# Patient Record
Sex: Male | Born: 1990 | Race: White | Hispanic: No | Marital: Married | Smoking: Never smoker
Health system: Southern US, Community
[De-identification: ages and names within clinical notes are randomized; demographics above are authoritative.]

## PROBLEM LIST (undated history)

## (undated) DIAGNOSIS — I10 Essential (primary) hypertension: Secondary | ICD-10-CM

---

## 2016-03-30 ENCOUNTER — Ambulatory Visit (INDEPENDENT_AMBULATORY_CARE_PROVIDER_SITE_OTHER): Payer: PRIVATE HEALTH INSURANCE | Admitting: Family Medicine

## 2016-03-30 ENCOUNTER — Encounter: Payer: Self-pay | Admitting: Family Medicine

## 2016-03-30 VITALS — BP 136/82 | Ht 72.0 in | Wt 289.0 lb

## 2016-03-30 DIAGNOSIS — Z13220 Encounter for screening for lipoid disorders: Secondary | ICD-10-CM | POA: Diagnosis not present

## 2016-03-30 DIAGNOSIS — R946 Abnormal results of thyroid function studies: Secondary | ICD-10-CM | POA: Diagnosis not present

## 2016-03-30 DIAGNOSIS — R7989 Other specified abnormal findings of blood chemistry: Secondary | ICD-10-CM

## 2016-03-30 DIAGNOSIS — Z0000 Encounter for general adult medical examination without abnormal findings: Secondary | ICD-10-CM | POA: Diagnosis not present

## 2016-03-30 NOTE — Progress Notes (Signed)
   Subjective:    Patient ID: Frank Ruiz, male    DOB: 02/14/1991, 25 y.o.   MRN: [id number]  HPI The patient comes in today for a wellness visit.  Gets reg exervcise  Not the best  Not a flu shot person   Health overall good, played football and wrestling in school, btroke thumb       A review of their health history was completed.  A review of medications was also completed.  Any needed refills; suppose to be on thyroid med but hasn't seen a doctor for refills  Eating habits: ok  Falls/  MVA accidents in past few months: none  Regular exercise: none  Specialist pt sees on regular basis: none  Preventative health issues were discussed.   Additional concerns: need rx for thyroid medicine    Review of Systems  Constitutional: Negative for activity change, appetite change and fever.  HENT: Negative for congestion and rhinorrhea.   Eyes: Negative for discharge.  Respiratory: Negative for cough and wheezing.   Cardiovascular: Negative for chest pain.  Gastrointestinal: Negative for abdominal pain, blood in stool and vomiting.  Genitourinary: Negative for difficulty urinating and frequency.  Musculoskeletal: Negative for neck pain.  Skin: Negative for rash.  Allergic/Immunologic: Negative for environmental allergies and food allergies.  Neurological: Negative for weakness and headaches.  Psychiatric/Behavioral: Negative for agitation.  All other systems reviewed and are negative.      Objective:   Physical Exam  Constitutional: He appears well-developed and well-nourished.  HENT:  Head: Normocephalic and atraumatic.  Right Ear: External ear normal.  Left Ear: External ear normal.  Nose: Nose normal.  Mouth/Throat: Oropharynx is clear and moist.  Eyes: EOM are normal. Pupils are equal, round, and reactive to light.  Neck: Normal range of motion. Neck supple. No thyromegaly present.  Cardiovascular: Normal rate, regular rhythm and normal heart sounds.   No  murmur heard. Pulmonary/Chest: Effort normal and breath sounds normal. No respiratory distress. He has no wheezes.  Abdominal: Soft. Bowel sounds are normal. He exhibits no distension and no mass. There is no tenderness.  Genitourinary: Penis normal.  Musculoskeletal: Normal range of motion. He exhibits no edema.  Lymphadenopathy:    He has no cervical adenopathy.  Neurological: He is alert. He exhibits normal muscle tone.  Skin: Skin is warm and dry. No erythema.  Psychiatric: He has a normal mood and affect. His behavior is normal. Judgment normal.  Vitals reviewed.         Assessment & Plan:  Impression wellness exam #2 hypothyroidism per history. No symptoms consistent. Positive family history of hypothyroidism. Plan appropriate blood work. Await TSH results before reinitiating thyroid medicine rationale discussed with patient. Diet exercise discussed

## 2016-06-12 ENCOUNTER — Ambulatory Visit (INDEPENDENT_AMBULATORY_CARE_PROVIDER_SITE_OTHER): Payer: PRIVATE HEALTH INSURANCE | Admitting: Family Medicine

## 2016-06-12 ENCOUNTER — Encounter: Payer: Self-pay | Admitting: Family Medicine

## 2016-06-12 VITALS — BP 130/82 | Temp 98.3°F | Ht 72.0 in | Wt 297.6 lb

## 2016-06-12 DIAGNOSIS — H6502 Acute serous otitis media, left ear: Secondary | ICD-10-CM

## 2016-06-12 MED ORDER — AMOXICILLIN-POT CLAVULANATE 875-125 MG PO TABS: 1.0000 | ORAL_TABLET | Freq: Two times a day (BID) | ORAL | 0 refills | Status: AC

## 2016-06-12 NOTE — Progress Notes (Signed)
   Subjective:    Patient ID: Frank Ruiz, male    DOB: 11/21/1990, 26 y.o.   MRN: [id number]  Otalgia   There is pain in the left ear. The current episode started in the past 7 days. Associated symptoms include a sore throat.   Started sun morn  Started hurting some   Ear apin the worse, now throat and ear  Last week some runny nose  Throat more sore furing all day and night , left side  No fever, used topical wax agent      Review of Systems  HENT: Positive for ear pain and sore throat.    No oiting or diarrhea     Objective:   Physical Exam  aert vitas Alert vital stable no acute distress HEENT moderate nasal congestion left otitis media. Old scarring. Mild preauricular lymph node somewhat tender lungs clear. Heart regular in rhythm      Assessment & Plan:  Impression left otitis media plan antibiotics prescribed symptom care discussed warning signs discussed

## 2016-12-11 ENCOUNTER — Ambulatory Visit (INDEPENDENT_AMBULATORY_CARE_PROVIDER_SITE_OTHER): Payer: PRIVATE HEALTH INSURANCE | Admitting: Family Medicine

## 2016-12-11 ENCOUNTER — Encounter: Payer: Self-pay | Admitting: Family Medicine

## 2016-12-11 VITALS — BP 138/80 | Ht 72.0 in | Wt 297.0 lb

## 2016-12-11 DIAGNOSIS — Z13220 Encounter for screening for lipoid disorders: Secondary | ICD-10-CM

## 2016-12-11 DIAGNOSIS — Z79899 Other long term (current) drug therapy: Secondary | ICD-10-CM | POA: Diagnosis not present

## 2016-12-11 DIAGNOSIS — Z0000 Encounter for general adult medical examination without abnormal findings: Secondary | ICD-10-CM | POA: Diagnosis not present

## 2016-12-11 NOTE — Progress Notes (Signed)
   Subjective:    Patient ID: Frank Ruiz, male    DOB: 01/25/1991, 26 y.o.   MRN: [id number]  HPI The patient comes in today for a wellness visit.    A review of their health history was completed.  A review of medications was also completed.  Any needed refills; None  Eating habits: Good  Falls/  MVA accidents in past few months: None  Regular exercise: No  Specialist pt sees on regular basis: None  Preventative health issues were discussed.   Additional concerns: None  Exercise not too much  Played football and def end    Hx of thub fx   Diet not the best    Review of Systems  Constitutional: Negative for activity change, appetite change and fever.  HENT: Negative for congestion and rhinorrhea.   Eyes: Negative for discharge.  Respiratory: Negative for cough and wheezing.   Cardiovascular: Negative for chest pain.  Gastrointestinal: Negative for abdominal pain, blood in stool and vomiting.  Genitourinary: Negative for difficulty urinating and frequency.  Musculoskeletal: Negative for neck pain.  Skin: Negative for rash.  Allergic/Immunologic: Negative for environmental allergies and food allergies.  Neurological: Negative for weakness and headaches.  Psychiatric/Behavioral: Negative for agitation.  All other systems reviewed and are negative.      Objective:   Physical Exam  Constitutional: He appears well-developed and well-nourished.  HENT:  Head: Normocephalic and atraumatic.  Right Ear: External ear normal.  Left Ear: External ear normal.  Nose: Nose normal.  Mouth/Throat: Oropharynx is clear and moist.  Eyes: Pupils are equal, round, and reactive to light. EOM are normal.  Neck: Normal range of motion. Neck supple. No thyromegaly present.  Cardiovascular: Normal rate, regular rhythm and normal heart sounds.   No murmur heard. Pulmonary/Chest: Effort normal and breath sounds normal. No respiratory distress. He has no wheezes.  Abdominal: Soft.  Bowel sounds are normal. He exhibits no distension and no mass. There is no tenderness.  Genitourinary: Penis normal.  Musculoskeletal: Normal range of motion. He exhibits no edema.  Lymphadenopathy:    He has no cervical adenopathy.  Neurological: He is alert. He exhibits normal muscle tone.  Skin: Skin is warm and dry. No erythema.  Psychiatric: He has a normal mood and affect. His behavior is normal. Judgment normal.  Vitals reviewed.         Assessment & Plan:  Impression 1 well adult exam #2 borderline blood pressure discussed #3 obesity discussed plan diet exercise discussed. Appropriate blood work. Further recommendations based results.

## 2017-04-11 ENCOUNTER — Encounter: Payer: Self-pay | Admitting: Family Medicine

## 2017-04-11 ENCOUNTER — Ambulatory Visit: Payer: PRIVATE HEALTH INSURANCE | Admitting: Family Medicine

## 2017-04-11 VITALS — BP 138/98 | Temp 98.7°F | Ht 73.0 in | Wt <= 1120 oz

## 2017-04-11 DIAGNOSIS — J329 Chronic sinusitis, unspecified: Secondary | ICD-10-CM

## 2017-04-11 MED ORDER — AMOXICILLIN-POT CLAVULANATE 875-125 MG PO TABS: ORAL_TABLET | ORAL | 0 refills | Status: DC

## 2017-04-11 NOTE — Progress Notes (Signed)
   Subjective:    Patient ID: Frank Ruiz, male    DOB: 05/21/1990, 27 y.o.   MRN: [id number]  Sinusitis  This is a new problem. Episode onset: over one week  Associated symptoms include congestion, coughing, ear pain and a sore throat. Treatments tried: otc cold meds. The treatment provided no relief.   Started after christmas  Started over a week ago  Had cong and dranage  The started coughing  Throat then got sore  Some prod cough  Right ear  Pain at times, hx of o m in this ear   Some cough  No energy   pts  inlaw got sick similar illness     No fever    Review of Systems  HENT: Positive for congestion, ear pain and sore throat.   Respiratory: Positive for cough.        Objective:   Physical Exam  Alert, mild malaise. Hydration good Vitals stable. frontal/ maxillary tenderness evident positive nasal congestion. pharynx normal neck supple  lungs clear/no crackles or wheezes. heart regular in rhythm       Assessment & Plan:  Alert, mild malaise. Hydration good Vitals stable. frontal/ maxillary tenderness evident positive nasal congestion. pharynx normal neck supple  lungs clear/no crackles or wheezes. heart regular in rhythm Impression rhinosinusitis likely post viral, discussed with patient. plan antibiotics prescribed. Questions answered. Symptomatic care discussed. warning signs discussed. WSL

## 2017-05-01 ENCOUNTER — Encounter: Payer: Self-pay | Admitting: Family Medicine

## 2017-05-01 ENCOUNTER — Ambulatory Visit: Payer: PRIVATE HEALTH INSURANCE | Admitting: Family Medicine

## 2017-05-01 VITALS — BP 132/90 | Temp 98.5°F | Ht 73.0 in | Wt 305.0 lb

## 2017-05-01 DIAGNOSIS — H6501 Acute serous otitis media, right ear: Secondary | ICD-10-CM

## 2017-05-01 MED ORDER — CEFDINIR 300 MG PO CAPS: 300.0000 mg | ORAL_CAPSULE | Freq: Two times a day (BID) | ORAL | 0 refills | Status: DC

## 2017-05-01 NOTE — Progress Notes (Signed)
   Subjective:    Patient ID: Frank Ruiz, male    DOB: 06/13/1990, 27 y.o.   MRN: [id number]  Sinusitis  This is a new problem. Episode onset: 3 days. Associated symptoms include ear pain and a sore throat. Past treatments include nothing.   May have had some cong  Last wk   Now right ear hurtingAt times pain sharp at times aching worse when coughing.  And throat positive discomfort diminished energy alert active good hydration  No fever       Review of Systems  HENT: Positive for ear pain and sore throat.        Objective:   Physical Exam  Distinct right otitis media.  Positive nasal congestion left TM fluid pharynx normal neck supple lungs clear heart regular rhythm alert no acute distress      Assessment & Plan:  Impression post viral right otitis media with left TM effusion discuss antibiotics prescribed symptom care discussed

## 2017-05-02 ENCOUNTER — Telehealth: Payer: Self-pay | Admitting: Family Medicine

## 2017-05-02 NOTE — Telephone Encounter (Signed)
Patient advised per Dr Lorin PicketScott- They dont do those anymore, Dr Lorin PicketScott rec ibuprofen 200mg , take 3 pills every 6 hours as needed-if causes GI upset use Tylenol.  Patient verbalized understanding.

## 2017-05-02 NOTE — Telephone Encounter (Signed)
Pt called stating that he is in a lot of pain with his ears and is wanting to know if ear drops can be called in.    [facility]

## 2017-05-02 NOTE — Telephone Encounter (Signed)
Seen yesterday for right otitis media. Prescribed omnicef

## 2017-05-02 NOTE — Telephone Encounter (Signed)
They dont do those anymore, I rec ibuprofen 200mg , take 3 pills every 6 hours as needed-if causes GI upset use Tylenol

## 2017-12-16 ENCOUNTER — Encounter: Payer: PRIVATE HEALTH INSURANCE | Admitting: Family Medicine

## 2018-01-08 ENCOUNTER — Ambulatory Visit (INDEPENDENT_AMBULATORY_CARE_PROVIDER_SITE_OTHER): Payer: PRIVATE HEALTH INSURANCE | Admitting: Family Medicine

## 2018-01-08 ENCOUNTER — Encounter: Payer: Self-pay | Admitting: Family Medicine

## 2018-01-08 VITALS — BP 150/108 | Ht 73.0 in | Wt 313.1 lb

## 2018-01-08 DIAGNOSIS — Z0000 Encounter for general adult medical examination without abnormal findings: Secondary | ICD-10-CM

## 2018-01-08 DIAGNOSIS — Z13220 Encounter for screening for lipoid disorders: Secondary | ICD-10-CM | POA: Diagnosis not present

## 2018-01-08 DIAGNOSIS — Z79899 Other long term (current) drug therapy: Secondary | ICD-10-CM

## 2018-01-08 DIAGNOSIS — I10 Essential (primary) hypertension: Secondary | ICD-10-CM

## 2018-01-08 MED ORDER — AMLODIPINE BESYLATE 5 MG PO TABS: ORAL_TABLET | ORAL | 5 refills | Status: DC

## 2018-01-08 NOTE — Patient Instructions (Signed)

## 2018-01-08 NOTE — Progress Notes (Signed)
   Subjective:    Patient ID: Frank Ruiz, male    DOB: 1990-09-26, 27 y.o.   MRN: [id number]  HPI The patient comes in today for a wellness visit.    A review of their health history was completed.  A review of medications was also completed.  Any needed refills; No  Eating habits: Good  Falls/  MVA accidents in past few months: Yes, but did not get hurt.  Regular exercise: Yes  Specialist pt sees on regular basis: No  Preventative health issues were discussed.   Additional concerns: None  Playing fot ball     Review of Systems  Constitutional: Negative for activity change, appetite change and fever.  HENT: Negative for congestion and rhinorrhea.   Eyes: Negative for discharge.  Respiratory: Negative for cough and wheezing.   Cardiovascular: Negative for chest pain.  Gastrointestinal: Negative for abdominal pain, blood in stool and vomiting.  Genitourinary: Negative for difficulty urinating and frequency.  Musculoskeletal: Negative for neck pain.  Skin: Negative for rash.  Allergic/Immunologic: Negative for environmental allergies and food allergies.  Neurological: Negative for weakness and headaches.  Psychiatric/Behavioral: Negative for agitation.  All other systems reviewed and are negative.      Objective:   Physical Exam  Constitutional: He appears well-developed and well-nourished.  HENT:  Head: Normocephalic and atraumatic.  Right Ear: External ear normal.  Left Ear: External ear normal.  Nose: Nose normal.  Mouth/Throat: Oropharynx is clear and moist.  Eyes: Pupils are equal, round, and reactive to light. EOM are normal.  Neck: Normal range of motion. Neck supple. No thyromegaly present.  Cardiovascular: Normal rate, regular rhythm and normal heart sounds.  No murmur heard. Pulmonary/Chest: Effort normal and breath sounds normal. No respiratory distress. He has no wheezes.  Abdominal: Soft. Bowel sounds are normal. He exhibits no distension and no  mass. There is no tenderness.  Genitourinary: Penis normal.  Musculoskeletal: Normal range of motion. He exhibits no edema.  Lymphadenopathy:    He has no cervical adenopathy.  Neurological: He is alert. He exhibits normal muscle tone.  Skin: Skin is warm and dry. No erythema.  Psychiatric: He has a normal mood and affect. His behavior is normal. Judgment normal.  Vitals reviewed.         Assessment & Plan:  Impression wellness exam.  Diet discussed.  Exercise discussed.  Patient now participating in a semi-pro football.  Did not get blood work as requested.  More ordered at this time  2.  Essential hypertension.  Blood pressure is substantially elevated today.  Strong family history.  Patient has not had this for over one year.  Continues to rise.  Pros and cons and potential side effects of medicines discussed.  Patient agrees to initiate Norvasc 5 mg daily.  Follow-up in 3 months.

## 2018-01-09 LAB — HEPATIC FUNCTION PANEL
ALK PHOS: 80 IU/L (ref 39–117)
ALT: 47 IU/L — AB (ref 0–44)
AST: 27 IU/L (ref 0–40)
Albumin: 4.5 g/dL (ref 3.5–5.5)
BILIRUBIN TOTAL: 0.4 mg/dL (ref 0.0–1.2)
BILIRUBIN, DIRECT: 0.13 mg/dL (ref 0.00–0.40)
Total Protein: 7.3 g/dL (ref 6.0–8.5)

## 2018-01-09 LAB — LIPID PANEL
CHOL/HDL RATIO: 4.5 ratio (ref 0.0–5.0)
CHOLESTEROL TOTAL: 171 mg/dL (ref 100–199)
HDL: 38 mg/dL — ABNORMAL LOW (ref >39)
LDL Calculated: 113 mg/dL — ABNORMAL HIGH (ref 0–99)
TRIGLYCERIDES: 101 mg/dL (ref 0–149)
VLDL Cholesterol Cal: 20 mg/dL (ref 5–40)

## 2018-01-09 LAB — BASIC METABOLIC PANEL
BUN / CREAT RATIO: 11 (ref 9–20)
BUN: 13 mg/dL (ref 6–20)
CHLORIDE: 102 mmol/L (ref 96–106)
CO2: 25 mmol/L (ref 20–29)
Calcium: 9.2 mg/dL (ref 8.7–10.2)
Creatinine, Ser: 1.16 mg/dL (ref 0.76–1.27)
GFR calc non Af Amer: 87 mL/min/{1.73_m2} (ref >59)
GFR, EST AFRICAN AMERICAN: 101 mL/min/{1.73_m2} (ref >59)
Glucose: 88 mg/dL (ref 65–99)
POTASSIUM: 4.6 mmol/L (ref 3.5–5.2)
SODIUM: 140 mmol/L (ref 134–144)

## 2018-01-12 ENCOUNTER — Encounter: Payer: Self-pay | Admitting: Family Medicine

## 2018-04-11 ENCOUNTER — Encounter: Payer: Self-pay | Admitting: Family Medicine

## 2018-04-11 ENCOUNTER — Ambulatory Visit: Payer: PRIVATE HEALTH INSURANCE | Admitting: Family Medicine

## 2018-04-11 VITALS — BP 132/84 | Ht 73.0 in | Wt 304.8 lb

## 2018-04-11 DIAGNOSIS — H6502 Acute serous otitis media, left ear: Secondary | ICD-10-CM

## 2018-04-11 DIAGNOSIS — I10 Essential (primary) hypertension: Secondary | ICD-10-CM | POA: Diagnosis not present

## 2018-04-11 MED ORDER — AMLODIPINE BESYLATE 5 MG PO TABS: ORAL_TABLET | ORAL | 5 refills | Status: DC

## 2018-04-11 MED ORDER — AMOXICILLIN-POT CLAVULANATE 875-125 MG PO TABS: ORAL_TABLET | ORAL | 0 refills | Status: DC

## 2018-04-11 NOTE — Progress Notes (Signed)
   Subjective:    Patient ID: Frank Ruiz, male    DOB: 18-Mar-1991, 28 y.o.   MRN: [id number]  Hypertension  This is a chronic problem. The current episode started more than 1 year ago. Risk factors for coronary artery disease include male gender. Treatments tried: norvasc 5 mg. There are no compliance problems.    Blood pressure medicine and blood pressure levels reviewed today with patient. Compliant with blood pressure medicine. States does not miss a dose. No obvious side effects. Blood pressure generally good when checked elsewhere. Watching salt intake.  Handling the meds well   bp elsewhere is  Ok    Last 2 weeks has had congestion.  Fullness and both ears pain in her left ear.  Positive history of major ear infections in the past   Review of Systems No headache, no major weight loss or weight gain, no chest pain no back pain abdominal pain no change in bowel habits complete ROS otherwise negative     Objective:   Physical Exam   Alert vitals stable, NAD. Blood pressure good on repeat. HEENT left otitis media otherwise normal. Lungs clear. Heart regular rate and rhythm.      Assessment & Plan:  Impression hypertension good control discussed diet discussed exercise discussed  2.  Left otitis media.  Post viral syndrome.  Augmentin twice daily 10 days symptom care discussed  Follow-up in 6 months diet exercise discussed

## 2018-05-09 ENCOUNTER — Ambulatory Visit: Payer: PRIVATE HEALTH INSURANCE | Admitting: Family Medicine

## 2018-05-09 ENCOUNTER — Encounter: Payer: Self-pay | Admitting: Family Medicine

## 2018-05-09 VITALS — BP 130/90 | Temp 97.7°F | Wt 307.2 lb

## 2018-05-09 DIAGNOSIS — J019 Acute sinusitis, unspecified: Secondary | ICD-10-CM | POA: Diagnosis not present

## 2018-05-09 MED ORDER — CEFDINIR 300 MG PO CAPS: 300.0000 mg | ORAL_CAPSULE | Freq: Two times a day (BID) | ORAL | 0 refills | Status: AC

## 2018-05-09 NOTE — Progress Notes (Signed)
   Subjective:    Patient ID: Frank Ruiz, male    DOB: 28-Jun-1990, 28 y.o.   MRN: [id number]  HPI Pt here today for head congestion, ear pain/fullness, headache and sore throat. No treatments tried at this time. Reports left ear is worse than right. No fever. Slight cough productive of mucous. Reports decreased energy levels. Appetite is good. No bodyaches.   Congestion and cough started 5 days, ear pain last 2 days.   Was treated for AOM of left ear on 04/11/18, finished abx, states symptoms resolved.   Review of Systems  Constitutional: Positive for activity change. Negative for appetite change, chills and fever.  HENT: Positive for congestion, ear pain and sore throat. Negative for sinus pressure and sinus pain.   Respiratory: Positive for cough. Negative for shortness of breath and wheezing.   Gastrointestinal: Negative for abdominal pain, diarrhea, nausea and vomiting.  Musculoskeletal: Negative for myalgias.       Objective:   Physical Exam Vitals signs and nursing note reviewed.  Constitutional:      General: He is not in acute distress.    Appearance: Normal appearance. He is not toxic-appearing.  HENT:     Head: Normocephalic and atraumatic.     Ears:     Comments: TM dull bilaterally, no erythema    Nose: Congestion present.     Mouth/Throat:     Mouth: Mucous membranes are moist.     Pharynx: Oropharynx is clear.  Eyes:     General:        Right eye: No discharge.        Left eye: No discharge.  Neck:     Musculoskeletal: Neck supple. No neck rigidity.  Cardiovascular:     Rate and Rhythm: Normal rate and regular rhythm.     Heart sounds: Normal heart sounds.  Pulmonary:     Effort: Pulmonary effort is normal. No respiratory distress.     Breath sounds: Normal breath sounds.  Lymphadenopathy:     Cervical: No cervical adenopathy.  Skin:    General: Skin is warm and dry.  Neurological:     Mental Status: He is alert and oriented to person, place, and time.    Psychiatric:        Mood and Affect: Mood normal.       Assessment & Plan:  Acute rhinosinusitis  Discussed likely post-viral etiology, likely early ear involvement, will cover with abx. Symptomatic care discussed, warning signs discussed. F/u if symptoms worsen or fail to improve.

## 2018-06-26 ENCOUNTER — Other Ambulatory Visit: Payer: Self-pay | Admitting: Family Medicine

## 2018-07-17 ENCOUNTER — Other Ambulatory Visit: Payer: Self-pay

## 2018-07-17 ENCOUNTER — Ambulatory Visit (INDEPENDENT_AMBULATORY_CARE_PROVIDER_SITE_OTHER): Payer: PRIVATE HEALTH INSURANCE | Admitting: Family Medicine

## 2018-07-17 DIAGNOSIS — J301 Allergic rhinitis due to pollen: Secondary | ICD-10-CM | POA: Diagnosis not present

## 2018-07-17 DIAGNOSIS — J019 Acute sinusitis, unspecified: Secondary | ICD-10-CM | POA: Diagnosis not present

## 2018-07-17 MED ORDER — AZELASTINE HCL 0.1 % NA SOLN: 2.0000 | Freq: Two times a day (BID) | NASAL | 12 refills | Status: DC

## 2018-07-17 MED ORDER — AMOXICILLIN-POT CLAVULANATE 875-125 MG PO TABS: 1.0000 | ORAL_TABLET | Freq: Two times a day (BID) | ORAL | 0 refills | Status: DC

## 2018-07-17 NOTE — Progress Notes (Signed)
   Subjective:    Patient ID: Frank Ruiz, male    DOB: 11-Jun-1990, 28 y.o.   MRN: [id number] Video and audio Patient was at work We were present at office Coronavirus outbreak  HPI  Patient is having left ear pain for a few weeks. Patient reports some sinus issues associated with the ear pain and loss of hearing. Patient relates a lot of head congestion sinus pressure and has difficult time getting a good breath through his nose as well as ear pain and discomfort and feels like his ear is full like he cannot hear out of it well he denies high fever chills sweats wheezing difficulty breathing.  Denies vomiting diarrhea.  No chest pressure or tightness.  No sweats.  Energy level overall pretty good.  Works outside a lot. Virtual Visit via Video Note  I connected with Frank Ruiz on 07/17/18 at  3:50 PM EDT by a video enabled telemedicine application and verified that I am speaking with the correct person using two identifiers.   I discussed the limitations of evaluation and management by telemedicine and the availability of in person appointments. The patient expressed understanding and agreed to proceed.  History of Present Illness:    Observations/Objective:   Assessment and Plan:   Follow Up Instructions:    I discussed the assessment and treatment plan with the patient. The patient was provided an opportunity to ask questions and all were answered. The patient agreed with the plan and demonstrated an understanding of the instructions.   The patient was advised to call back or seek an in-person evaluation if the symptoms worsen or if the condition fails to improve as anticipated.  I provided 15 minutes of non-face-to-face time during this encounter.      Review of Systems     Objective:   Physical Exam    Unable to do physical exam via video    Assessment & Plan:  This seems that the patient most likely has allergic rhinitis along with acute rhinosinusitis and left  ear probable eustachian tube dysfunction versus left ear fluid I would recommend allergy tablet every single day loratadine 10 mg Astelin nasal spray 2 sprays each nostril twice daily Augmentin twice daily with a snack for the next 10 days If progressive troubles or worse to follow-up Warning signs were discussed If high fevers or other problems notify us

## 2018-08-21 ENCOUNTER — Ambulatory Visit (INDEPENDENT_AMBULATORY_CARE_PROVIDER_SITE_OTHER): Payer: PRIVATE HEALTH INSURANCE | Admitting: Family Medicine

## 2018-08-21 ENCOUNTER — Other Ambulatory Visit: Payer: Self-pay

## 2018-08-21 DIAGNOSIS — H6502 Acute serous otitis media, left ear: Secondary | ICD-10-CM | POA: Diagnosis not present

## 2018-08-21 MED ORDER — AMOXICILLIN-POT CLAVULANATE 875-125 MG PO TABS: ORAL_TABLET | ORAL | 0 refills | Status: DC

## 2018-08-21 NOTE — Progress Notes (Signed)
   Subjective:    Patient ID: Frank Ruiz, male    DOB: 1990-05-22, 28 y.o.   MRN: [id number] Audio plus vid HPI Pt has been having right ear pain. Pt also states that he having runny nose, nasty taste in mouth, ear pain. No drainage from ear but does feel stopped up. No fever. Pt has taken Tylenol to help with pain.    Virtual Visit via Video Note  I connected with Frank Ruiz on 08/21/18 at 11:00 AM EDT by a video enabled telemedicine application and verified that I am speaking with the correct person using two identifiers.  Location: Patient: home Provider: office   I discussed the limitations of evaluation and management by telemedicine and the availability of in person appointments. The patient expressed understanding and agreed to proceed.  History of Present Illness:    Observations/Objective:   Assessment and Plan:   Follow Up Instructions:    I discussed the assessment and treatment plan with the patient. The patient was provided an opportunity to ask questions and all were answered. The patient agreed with the plan and demonstrated an understanding of the instructions.   The patient was advised to call back or seek an in-person evaluation if the symptoms worsen or if the condition fails to improve as anticipated.  I provided15 minutes of non-face-to-face time during this encounter.  Progressive symptoms.  Frontal headache.  Ear discomfort.  Fullness.  Diminished hearing.  Some pain and some throat pain.  Substantial exposures to allergens   [place], LPN   Review of Systems No headache, no major weight loss or weight gain, no chest pain no back pain abdominal pain no change in bowel habits complete ROS otherwise negative     Objective:   Physical Exam   virt      Assessment & Plan:  Impression probable left otitis media with potential sinusitis.  Positive allergy element.  Discussed.  Antihistamine recommended.  Antibiotics prescribed.  Warning signs  discussed

## 2018-10-17 ENCOUNTER — Ambulatory Visit: Payer: PRIVATE HEALTH INSURANCE | Admitting: Family Medicine

## 2019-01-12 ENCOUNTER — Encounter: Payer: PRIVATE HEALTH INSURANCE | Admitting: Family Medicine

## 2019-01-20 ENCOUNTER — Other Ambulatory Visit: Payer: Self-pay

## 2019-01-20 DIAGNOSIS — Z20822 Contact with and (suspected) exposure to covid-19: Secondary | ICD-10-CM

## 2019-01-22 ENCOUNTER — Telehealth: Payer: Self-pay | Admitting: Family Medicine

## 2019-01-22 LAB — NOVEL CORONAVIRUS, NAA: SARS-CoV-2, NAA: NOT DETECTED

## 2019-01-22 NOTE — Telephone Encounter (Signed)
Pt was given neg COVID results  °

## 2019-02-18 ENCOUNTER — Telehealth: Payer: Self-pay | Admitting: Family Medicine

## 2019-02-18 DIAGNOSIS — Z79899 Other long term (current) drug therapy: Secondary | ICD-10-CM

## 2019-02-18 DIAGNOSIS — Z13220 Encounter for screening for lipoid disorders: Secondary | ICD-10-CM

## 2019-02-18 DIAGNOSIS — Z0000 Encounter for general adult medical examination without abnormal findings: Secondary | ICD-10-CM

## 2019-02-18 DIAGNOSIS — I10 Essential (primary) hypertension: Secondary | ICD-10-CM

## 2019-02-18 NOTE — Telephone Encounter (Signed)
Patient requesting labs for physical on 11/30

## 2019-02-18 NOTE — Telephone Encounter (Signed)
Last labs completed on 01/08/18 BMET, HEPATIC and LIVER. Please advise. Thank you

## 2019-02-18 NOTE — Telephone Encounter (Signed)
same

## 2019-02-18 NOTE — Telephone Encounter (Signed)
Lab orders placed. (previous message should have said lipid not liver) pt contacted and verbalized understanding.

## 2019-03-07 ENCOUNTER — Other Ambulatory Visit: Payer: Self-pay | Admitting: Family Medicine

## 2019-03-09 ENCOUNTER — Encounter: Payer: Self-pay | Admitting: Family Medicine

## 2019-03-09 ENCOUNTER — Ambulatory Visit (INDEPENDENT_AMBULATORY_CARE_PROVIDER_SITE_OTHER): Payer: PRIVATE HEALTH INSURANCE | Admitting: Family Medicine

## 2019-03-09 ENCOUNTER — Other Ambulatory Visit: Payer: Self-pay

## 2019-03-09 VITALS — BP 130/86 | Temp 97.6°F | Ht 73.0 in | Wt 320.2 lb

## 2019-03-09 DIAGNOSIS — I10 Essential (primary) hypertension: Secondary | ICD-10-CM

## 2019-03-09 DIAGNOSIS — Z23 Encounter for immunization: Secondary | ICD-10-CM

## 2019-03-09 DIAGNOSIS — J019 Acute sinusitis, unspecified: Secondary | ICD-10-CM

## 2019-03-09 DIAGNOSIS — Z0000 Encounter for general adult medical examination without abnormal findings: Secondary | ICD-10-CM

## 2019-03-09 MED ORDER — AMLODIPINE BESYLATE 5 MG PO TABS: ORAL_TABLET | ORAL | 5 refills | Status: DC

## 2019-03-09 NOTE — Progress Notes (Signed)
Subjective:    Patient ID: Frank Ruiz, male    DOB: [birth year], 28 y.o.   MRN: [id number]  HPI  The patient comes in today for a wellness visit.    A review of their health history was completed.  A review of medications was also completed.  Any needed refills; yes  Eating habits: not too great  Falls/  MVA accidents in past few months: none  Regular exercise: not much- little bit  Specialist pt sees on regular basis: none  Preventative health issues were discussed.   Additional concerns: none  Results for orders placed or performed in visit on 01/20/19  Novel Coronavirus, NAA ([facility])   Specimen: Nasopharyngeal(NP) swabs in vial transport medium   NASOPHARYNGE  TESTING  Result Value Ref Range   SARS-CoV-2, NAA Not Detected Not Detected   Fly shot  recommednded   Driving the truck  Played sport  Not exercising much  Non smoker    Blood pressure medicine and blood pressure levels reviewed today with patient. Compliant with blood pressure medicine. States does not miss a dose. No obvious side effects. Blood pressure generally good when checked elsewhere. Watching salt intake.    Diet nt the best Review of Systems  Constitutional: Negative for activity change, appetite change and fever.  HENT: Negative for congestion and rhinorrhea.   Eyes: Negative for discharge.  Respiratory: Negative for cough and wheezing.   Cardiovascular: Negative for chest pain.  Gastrointestinal: Negative for abdominal pain, blood in stool and vomiting.  Genitourinary: Negative for difficulty urinating and frequency.  Musculoskeletal: Negative for neck pain.  Skin: Negative for rash.  Allergic/Immunologic: Negative for environmental allergies and food allergies.  Neurological: Negative for weakness and headaches.  Psychiatric/Behavioral: Negative for agitation.  All other systems reviewed and are negative.      Objective:   Physical Exam Vitals signs reviewed.   Constitutional:      Appearance: He is well-developed.  HENT:     Head: Normocephalic and atraumatic.     Right Ear: External ear normal.     Left Ear: External ear normal.     Nose: Nose normal.  Eyes:     Pupils: Pupils are equal, round, and reactive to light.  Neck:     Musculoskeletal: Normal range of motion and neck supple.     Thyroid: No thyromegaly.  Cardiovascular:     Rate and Rhythm: Normal rate and regular rhythm.     Heart sounds: Normal heart sounds. No murmur.  Pulmonary:     Effort: Pulmonary effort is normal. No respiratory distress.     Breath sounds: Normal breath sounds. No wheezing.  Abdominal:     General: Bowel sounds are normal. There is no distension.     Palpations: Abdomen is soft. There is no mass.     Tenderness: There is no abdominal tenderness.  Genitourinary:    Penis: Normal.   Musculoskeletal: Normal range of motion.  Lymphadenopathy:     Cervical: No cervical adenopathy.  Skin:    General: Skin is warm and dry.     Findings: No erythema.  Neurological:     Mental Status: He is alert.     Motor: No abnormal muscle tone.  Psychiatric:        Behavior: Behavior normal.        Judgment: Judgment normal.           Assessment & Plan:  Impression wellness exam.  Patient is morbidly obese.  Diet and  exercise discussed and encouraged.  Working hard but not exercising.  Medication for hypertension working well.  Patient to maintain same dose.  Follow-up in 6 months.  Tdap and flu shot today

## 2019-03-14 LAB — BASIC METABOLIC PANEL
BUN/Creatinine Ratio: 12 (ref 9–20)
BUN: 13 mg/dL (ref 6–20)
CO2: 25 mmol/L (ref 20–29)
Calcium: 9.7 mg/dL (ref 8.7–10.2)
Chloride: 102 mmol/L (ref 96–106)
Creatinine, Ser: 1.09 mg/dL (ref 0.76–1.27)
GFR calc Af Amer: 108 mL/min/{1.73_m2} (ref >59)
GFR calc non Af Amer: 93 mL/min/{1.73_m2} (ref >59)
Glucose: 98 mg/dL (ref 65–99)
Potassium: 4.5 mmol/L (ref 3.5–5.2)
Sodium: 139 mmol/L (ref 134–144)

## 2019-03-14 LAB — HEPATIC FUNCTION PANEL
ALT: 41 IU/L (ref 0–44)
AST: 26 IU/L (ref 0–40)
Albumin: 4.8 g/dL (ref 4.1–5.2)
Alkaline Phosphatase: 69 IU/L (ref 39–117)
Bilirubin Total: 0.4 mg/dL (ref 0.0–1.2)
Bilirubin, Direct: 0.13 mg/dL (ref 0.00–0.40)
Total Protein: 7.4 g/dL (ref 6.0–8.5)

## 2019-03-14 LAB — LIPID PANEL
Chol/HDL Ratio: 4.2 ratio (ref 0.0–5.0)
Cholesterol, Total: 173 mg/dL (ref 100–199)
HDL: 41 mg/dL (ref >39)
LDL Chol Calc (NIH): 116 mg/dL — ABNORMAL HIGH (ref 0–99)
Triglycerides: 85 mg/dL (ref 0–149)
VLDL Cholesterol Cal: 16 mg/dL (ref 5–40)

## 2019-03-15 ENCOUNTER — Encounter: Payer: Self-pay | Admitting: Family Medicine

## 2019-03-31 ENCOUNTER — Ambulatory Visit: Payer: PRIVATE HEALTH INSURANCE | Attending: Internal Medicine

## 2019-03-31 ENCOUNTER — Other Ambulatory Visit: Payer: Self-pay

## 2019-03-31 DIAGNOSIS — Z20822 Contact with and (suspected) exposure to covid-19: Secondary | ICD-10-CM

## 2019-04-02 ENCOUNTER — Telehealth: Payer: Self-pay

## 2019-04-02 LAB — NOVEL CORONAVIRUS, NAA: SARS-CoV-2, NAA: NOT DETECTED

## 2019-04-02 NOTE — Telephone Encounter (Signed)
Pt. Given COVID 19 results, verbalizes understanding. 

## 2019-04-10 ENCOUNTER — Other Ambulatory Visit: Payer: Self-pay

## 2019-04-10 ENCOUNTER — Ambulatory Visit
Admission: EM | Admit: 2019-04-10 | Discharge: 2019-04-10 | Disposition: A | Discharge status: Home / Self Care | Payer: PRIVATE HEALTH INSURANCE | Attending: Emergency Medicine | Admitting: Emergency Medicine

## 2019-04-10 DIAGNOSIS — H6502 Acute serous otitis media, left ear: Secondary | ICD-10-CM

## 2019-04-10 MED ORDER — FLUTICASONE PROPIONATE 50 MCG/ACT NA SUSP: 1.0000 | Freq: Every day | NASAL | 0 refills | Status: DC

## 2019-04-10 MED ORDER — AMOXICILLIN-POT CLAVULANATE 875-125 MG PO TABS: 1.0000 | ORAL_TABLET | Freq: Two times a day (BID) | ORAL | 0 refills | Status: DC

## 2019-04-10 MED ORDER — AMLODIPINE BESYLATE 5 MG PO TABS: ORAL_TABLET | ORAL | 5 refills | Status: DC

## 2019-04-10 NOTE — ED Provider Notes (Signed)
[facility]    CSN: [id number] Arrival date & time: 04/10/19  1447      History   Chief Complaint Chief Complaint  Patient presents with  . Dizziness    HPI Frank Ruiz is a 29 y.o. male.   Patient complains of dizziness that began 2 days ago.  Denies a precipitating event, trauma, or recent URI within the past month.  Describes the dizziness as off balance. States that it is  intermittent with episodes lasting 1  Hours. Report associated pain in left ear. Rated pain at 7.  Has tried  Tylenol without relief.  Nothing make his symptoms worse.  denies  previous symptoms in the past.  Denies fever, chills, nausea, vomiting, hearing changes, tinnitus, ear pain, chest pain, syncope, SOB, weakness, slurred speech, memory or emotional changes, facial drooping/ asymmetry, incoordination, numbness or tingling, abdominal pain, changes in bowel or bladder habits.    The history is provided by the patient. No language interpreter was used.    History reviewed. No pertinent past medical history.  Patient Active Problem List   Diagnosis Date Noted  . Essential hypertension 01/08/2018    History reviewed. No pertinent surgical history.     Home Medications    Prior to Admission medications   Medication Sig Start Date End Date Taking? Authorizing Provider  amoxicillin-clavulanate (AUGMENTIN) 875-125 MG tablet Take 1 tablet by mouth every 12 (twelve) hours. 04/10/19   Vi Whitesel, Zachery Dakins, FNP  fluticasone (FLONASE) 50 MCG/ACT nasal spray Place 1 spray into both nostrils daily for 14 days. 04/10/19 04/24/19  Susanne Baumgarner, Zachery Dakins, FNP  amLODipine (NORVASC) 5 MG tablet TAKE (1) TABLET BY MOUTH AT BEDTIME. 04/10/19 04/10/19  Marielena Harvell, Zachery Dakins, FNP    Family History Family History  Problem Relation Age of Onset  . Healthy Mother   . Healthy Father     Social History Social History   Tobacco Use  . Smoking status: Never Smoker  . Smokeless tobacco: Never Used  Substance Use Topics   . Alcohol use: Not on file  . Drug use: Not on file     Allergies   Patient has no known allergies.   Review of Systems Review of Systems  Constitutional: Negative.   HENT: Positive for ear pain.   Respiratory: Negative.   Cardiovascular: Negative.   Neurological: Positive for dizziness.     Physical Exam Triage Vital Signs ED Triage Vitals  Enc Vitals Group     BP 04/10/19 1507 (!) 145/89     Pulse Rate 04/10/19 1507 100     Resp 04/10/19 1507 16     Temp 04/10/19 1507 99.5 F (37.5 C)     Temp Source 04/10/19 1507 Oral     SpO2 04/10/19 1507 96 %     Weight --      Height --      Head Circumference --      Peak Flow --      Pain Score 04/10/19 1536 3     Pain Loc --      Pain Edu? --      Excl. in GC? --    No data found.  Updated Vital Signs BP (!) 145/89 (BP Location: Right Arm)   Pulse 100   Temp 99.5 F (37.5 C) (Oral)   Resp 16   SpO2 96%   Visual Acuity Right Eye Distance:   Left Eye Distance:   Bilateral Distance:    Right Eye Near:   Left  Eye Near:    Bilateral Near:     Physical Exam Vitals and nursing note reviewed.  Constitutional:      General: He is not in acute distress.    Appearance: He is not ill-appearing or toxic-appearing.  HENT:     Head: Normocephalic and atraumatic.     Right Ear: Tympanic membrane, ear canal and external ear normal. There is no impacted cerumen.     Left Ear: Ear canal and external ear normal. There is no impacted cerumen. Tympanic membrane is erythematous and bulging.  Cardiovascular:     Rate and Rhythm: Normal rate and regular rhythm.     Pulses: Normal pulses.     Heart sounds: Normal heart sounds.  Pulmonary:     Effort: Pulmonary effort is normal.     Breath sounds: Normal breath sounds.  Neurological:     Mental Status: He is alert and oriented to person, place, and time.     Cranial Nerves: Cranial nerves are intact. No cranial nerve deficit.     Sensory: No sensory deficit.     Motor:  Motor function is intact. No weakness.     Coordination: Coordination is intact.     Gait: Gait is intact.     Deep Tendon Reflexes:     Reflex Scores:      Patellar reflexes are 2+ on the right side and 2+ on the left side.     UC Treatments / Results  Labs (all labs ordered are listed, but only abnormal results are displayed) Labs Reviewed - No data to display  EKG   Radiology No results found.  Procedures Procedures (including critical care time)  Medications Ordered in UC Medications - No data to display  Initial Impression / Assessment and Plan / UC Course  I have reviewed the triage vital signs and the nursing notes.  Pertinent labs & imaging results that were available during my care of the patient were reviewed by me and considered in my medical decision making (see chart for details).   Patient stable for discharge.  Benign physical exam.  Augmentin will be prescribed.  To return for worsening of symptoms.  Patient verbalized an understanding of the plan of care.  Final Clinical Impressions(s) / UC Diagnoses   Final diagnoses:  Acute serous otitis media of left ear, recurrence not specified     Discharge Instructions     Rest and drink plenty of fluids Prescribed augmentin  Take medications as directed and to completion Continue to use OTC ibuprofen and/ or tylenol as needed for pain control Follow up with PCP if symptoms persists Return here or go to the ER if you have any new or worsening symptoms     ED Prescriptions    Medication Sig Dispense Auth. Provider   amoxicillin-clavulanate (AUGMENTIN) 875-125 MG tablet  (Status: Discontinued) Take 1 tablet by mouth every 12 (twelve) hours. 14 tablet Tifani Dack S, FNP   fluticasone (FLONASE) 50 MCG/ACT nasal spray  (Status: Discontinued) Place 1 spray into both nostrils daily for 14 days. 16 g Halley Shepheard, Darrelyn Hillock, FNP   amoxicillin-clavulanate (AUGMENTIN) 875-125 MG tablet  (Status: Discontinued) Take  1 tablet by mouth every 12 (twelve) hours. 14 tablet Briarrose Shor S, FNP   amLODipine (NORVASC) 5 MG tablet  (Status: Discontinued) TAKE (1) TABLET BY MOUTH AT BEDTIME. 30 tablet Jahmiya Guidotti S, FNP   fluticasone (FLONASE) 50 MCG/ACT nasal spray Place 1 spray into both nostrils daily for 14 days. 16 g  Durward Parcel, FNP   amoxicillin-clavulanate (AUGMENTIN) 875-125 MG tablet Take 1 tablet by mouth every 12 (twelve) hours. 14 tablet Makesha Belitz, Zachery Dakins, FNP     PDMP not reviewed this encounter.   Durward Parcel, FNP 04/10/19 1622

## 2019-04-10 NOTE — Discharge Instructions (Addendum)
Rest and drink plenty of fluids Prescribed augmentin.   Take medications as directed and to completion Continue to use OTC ibuprofen and/ or tylenol as needed for pain control Follow up with PCP if symptoms persists Return here or go to the ER if you have any new or worsening symptoms  

## 2019-04-10 NOTE — ED Triage Notes (Signed)
Pt presents to UC w/ c/o dizziness, ear pain x2 days. Pt has hx of ear infections.

## 2019-04-15 ENCOUNTER — Encounter (HOSPITAL_COMMUNITY): Payer: Self-pay | Admitting: Emergency Medicine

## 2019-04-15 ENCOUNTER — Emergency Department (HOSPITAL_COMMUNITY)
Admission: EM | Admit: 2019-04-15 | Discharge: 2019-04-15 | Disposition: A | Discharge status: Home / Self Care | Payer: PRIVATE HEALTH INSURANCE | Attending: Emergency Medicine | Admitting: Emergency Medicine

## 2019-04-15 ENCOUNTER — Other Ambulatory Visit: Payer: Self-pay

## 2019-04-15 ENCOUNTER — Emergency Department (HOSPITAL_COMMUNITY): Payer: PRIVATE HEALTH INSURANCE

## 2019-04-15 DIAGNOSIS — Z20822 Contact with and (suspected) exposure to covid-19: Secondary | ICD-10-CM | POA: Insufficient documentation

## 2019-04-15 DIAGNOSIS — R42 Dizziness and giddiness: Secondary | ICD-10-CM | POA: Diagnosis not present

## 2019-04-15 DIAGNOSIS — R0789 Other chest pain: Secondary | ICD-10-CM | POA: Insufficient documentation

## 2019-04-15 DIAGNOSIS — R0602 Shortness of breath: Secondary | ICD-10-CM

## 2019-04-15 DIAGNOSIS — R072 Precordial pain: Secondary | ICD-10-CM | POA: Insufficient documentation

## 2019-04-15 DIAGNOSIS — R519 Headache, unspecified: Secondary | ICD-10-CM | POA: Insufficient documentation

## 2019-04-15 DIAGNOSIS — I10 Essential (primary) hypertension: Secondary | ICD-10-CM | POA: Insufficient documentation

## 2019-04-15 HISTORY — DX: Essential (primary) hypertension: I10

## 2019-04-15 LAB — BASIC METABOLIC PANEL
Anion gap: 10 (ref 5–15)
BUN: 16 mg/dL (ref 6–20)
CO2: 24 mmol/L (ref 22–32)
Calcium: 8.8 mg/dL — ABNORMAL LOW (ref 8.9–10.3)
Chloride: 104 mmol/L (ref 98–111)
Creatinine, Ser: 1.27 mg/dL — ABNORMAL HIGH (ref 0.61–1.24)
GFR calc Af Amer: >60 mL/min (ref >60)
GFR calc non Af Amer: >60 mL/min (ref >60)
Glucose, Bld: 93 mg/dL (ref 70–99)
Potassium: 3.8 mmol/L (ref 3.5–5.1)
Sodium: 138 mmol/L (ref 135–145)

## 2019-04-15 LAB — CBC
HCT: 44.6 % (ref 39.0–52.0)
Hemoglobin: 14.9 g/dL (ref 13.0–17.0)
MCH: 27.8 pg (ref 26.0–34.0)
MCHC: 33.4 g/dL (ref 30.0–36.0)
MCV: 83.2 fL (ref 80.0–100.0)
Platelets: 198 10*3/uL (ref 150–400)
RBC: 5.36 MIL/uL (ref 4.22–5.81)
RDW: 11.9 % (ref 11.5–15.5)
WBC: 3.7 10*3/uL — ABNORMAL LOW (ref 4.0–10.5)
nRBC: 0 % (ref 0.0–0.2)

## 2019-04-15 LAB — TROPONIN I (HIGH SENSITIVITY)
Troponin I (High Sensitivity): 5 ng/L (ref <18)
Troponin I (High Sensitivity): 5 ng/L (ref <18)

## 2019-04-15 NOTE — Discharge Instructions (Signed)
Please quarantine at home until you get your test results. You likely have a viral URI. You appear to have an upper respiratory infection (URI). An upper respiratory tract infection, or cold, is a viral infection of the air passages leading to the lungs. It is contagious and can be spread to others, especially during the first 3 or 4 days. It cannot be cured by antibiotics or other medicines. RETURN IMMEDIATELY IF you develop shortness of breath, confusion or altered mental status, a new rash, become dizzy, faint, or poorly responsive, or are unable to be cared for at home.  Your caregiver has diagnosed you as having chest pain that is not specific for one problem, but does not require admission.  You are at low risk for an acute heart condition or other serious illness. Chest pain comes from many different causes.  SEEK IMMEDIATE MEDICAL ATTENTION IF: You have severe chest pain, especially if the pain is crushing or pressure-like and spreads to the arms, back, neck, or jaw, or if you have sweating, nausea (feeling sick to your stomach), or shortness of breath. THIS IS AN EMERGENCY. Don't wait to see if the pain will go away. Get medical help at once. Call 911 or 0 (operator). DO NOT drive yourself to the hospital.  Your chest pain gets worse and does not go away with rest.  You have an attack of chest pain lasting longer than usual, despite rest and treatment with the medications your caregiver has prescribed.  You wake from sleep with chest pain or shortness of breath.  You feel dizzy or faint.  You have chest pain not typical of your usual pain for which you originally saw your caregiver.

## 2019-04-15 NOTE — ED Provider Notes (Signed)
   Patient signed out to me by Arthor Captain, PA-C at end of shift, pending delta Trop.  Patient is a 29 year old male who presented with burning chest pain and cough.  He had some shortness of breath dizziness and headache.  Low-grade fever.  He was concerned about possible Covid due to exposure to coworkers who recently tested positive.    Labs Reviewed  BASIC METABOLIC PANEL - Abnormal; Notable for the following components:      Result Value   Creatinine, Ser 1.27 (*)    Calcium 8.8 (*)    All other components within normal limits  CBC - Abnormal; Notable for the following components:   WBC 3.7 (*)    All other components within normal limits  SARS CORONAVIRUS 2 (TAT 6-24 HRS)  TROPONIN I (HIGH SENSITIVITY)  TROPONIN I (HIGH SENSITIVITY)    Work-up this evening has been reassuring. Delta trop is unchanged.  His Covid test is pending.  He agrees to quarantine at home until results are back.  He is without hypoxia tachycardia or tachypnea.  He appears appropriate for discharge home.  Agrees to discharge instructions and he was advised to return if chest pain or shortness of breath worsens.    Pauline Aus, PA-C 04/15/19 2341    Mancel Bale, MD 04/16/19 1721

## 2019-04-15 NOTE — ED Notes (Signed)
ED Provider at bedside. 

## 2019-04-15 NOTE — ED Triage Notes (Signed)
Pt c/o central chest pain and SOB x 1 hour. Pt also c/o dizzyness, headache, and a fever. Pt states he has been around covid + people at work.

## 2019-04-15 NOTE — ED Provider Notes (Signed)
[facility] EMERGENCY DEPARTMENT Provider Note   CSN: [id number] Arrival date & time: 04/15/19  1924     History Chief Complaint  Patient presents with  . Chest Pain    Frank Ruiz is a 29 y.o. male who presents emergency department with chief complaint of burning chest pain when he breathes.  Patient states that he started feeling some burning when he would inhale deeply like he is getting a cold.  That began yesterday.  It is worse with coughing.  He denies pressure, nausea, vomiting or diaphoresis.  Patient had a recent left ear infection and completed a course of antibiotics. He has had multiple exposures to covid at work.    HPI     Past Medical History:  Diagnosis Date  . Hypertension     Patient Active Problem List   Diagnosis Date Noted  . Essential hypertension 01/08/2018    History reviewed. No pertinent surgical history.     Family History  Problem Relation Age of Onset  . Healthy Mother   . Healthy Father     Social History   Tobacco Use  . Smoking status: Never Smoker  . Smokeless tobacco: Never Used  Substance Use Topics  . Alcohol use: Not on file  . Drug use: Not on file    Home Medications Prior to Admission medications   Medication Sig Start Date End Date Taking? Authorizing Provider  amLODipine (NORVASC) 5 MG tablet Take 5 mg by mouth daily. 04/10/19   [provider]  amoxicillin-clavulanate (AUGMENTIN) 875-125 MG tablet Take 1 tablet by mouth every 12 (twelve) hours. 04/10/19   Avegno, Darrelyn Hillock, FNP  fluticasone (FLONASE) 50 MCG/ACT nasal spray Place 1 spray into both nostrils daily for 14 days. 04/10/19 04/24/19  Emerson Monte, FNP    Allergies    Patient has no known allergies.  Review of Systems   Review of Systems Ten systems reviewed and are negative for acute change, except as noted in the HPI.   Physical Exam Updated Vital Signs BP 139/85   Pulse 94   Temp 99.7 F (37.6 C)   Resp (!) 21   Ht 6\' 1"  (1.854 m)    Wt (!) 145.2 kg   SpO2 98%   BMI 42.22 kg/m   Physical Exam Vitals and nursing note reviewed.  Constitutional:      General: He is not in acute distress.    Appearance: He is well-developed. He is ill-appearing. He is not diaphoretic.  HENT:     Head: Normocephalic and atraumatic.  Eyes:     General: No scleral icterus.    Conjunctiva/sclera: Conjunctivae normal.  Cardiovascular:     Rate and Rhythm: Normal rate and regular rhythm.     Heart sounds: Normal heart sounds.  Pulmonary:     Effort: Pulmonary effort is normal. No respiratory distress.     Breath sounds: Normal breath sounds.  Abdominal:     Palpations: Abdomen is soft.     Tenderness: There is no abdominal tenderness.  Musculoskeletal:     Cervical back: Normal range of motion and neck supple.  Skin:    General: Skin is warm and dry.  Neurological:     Mental Status: He is alert.  Psychiatric:        Behavior: Behavior normal.     ED Results / Procedures / Treatments   Labs (all labs ordered are listed, but only abnormal results are displayed) Labs Reviewed  BASIC METABOLIC PANEL - Abnormal;  Notable for the following components:      Result Value   Creatinine, Ser 1.27 (*)    Calcium 8.8 (*)    All other components within normal limits  CBC - Abnormal; Notable for the following components:   WBC 3.7 (*)    All other components within normal limits  TROPONIN I (HIGH SENSITIVITY)  TROPONIN I (HIGH SENSITIVITY)    EKG EKG Interpretation  Date/Time:  Wednesday April 15 2019 19:39:14 EST Ventricular Rate:  105 PR Interval:  132 QRS Duration: 86 QT Interval:  330 QTC Calculation: 436 R Axis:   83 Text Interpretation: Sinus tachycardia T wave abnormality, consider inferior ischemia Abnormal ECG No old tracing to compare Confirmed by Mancel Bale [phone]) on 04/15/2019 7:46:11 PM   Radiology DG Chest Port 1 View  Result Date: 04/15/2019 CLINICAL DATA:  Chest pain and shortness of breath, recent  exposure to COVID-19 EXAM: PORTABLE CHEST 1 VIEW COMPARISON:  None. FINDINGS: Cardiac shadows within normal limits. The lungs are well aerated bilaterally. No focal infiltrate or sizable effusion is seen. IMPRESSION: No acute abnormality noted. Electronically Signed   By: Alcide Clever M.D.   On: 04/15/2019 20:50    Procedures Procedures (including critical care time)  Medications Ordered in ED Medications - No data to display  ED Course  I have reviewed the triage vital signs and the nursing notes.  Pertinent labs & imaging results that were available during my care of the patient were reviewed by me and considered in my medical decision making (see chart for details).    MDM Rules/Calculators/A&P                      Patient here with c/o URI sxs. He has a negative cxr on my interpretation. EKG shows sinus tachycardia with nonspecific T wave abnormalities. The patient has a normal Troponin. HEART score is wnl and will need a delta troponin. I have given sign out to PA Triplett who will assume care  Frank Ruiz was evaluated in Emergency Department on 04/17/2019 for the symptoms described in the history of present illness. He was evaluated in the context of the global COVID-19 pandemic, which necessitated consideration that the patient might be at risk for infection with the SARS-CoV-2 virus that causes COVID-19. Institutional protocols and algorithms that pertain to the evaluation of patients at risk for COVID-19 are in a state of rapid change based on information released by regulatory bodies including the CDC and federal and state organizations. These policies and algorithms were followed during the patient's care in the ED.  Final Clinical Impression(s) / ED Diagnoses Final diagnoses:  SOB (shortness of breath)    Rx / DC Orders ED Discharge Orders    None       Arthor Captain, PA-C 04/17/19 2308    Mancel Bale, MD 04/20/19 [phone]

## 2019-04-16 ENCOUNTER — Telehealth: Payer: Self-pay

## 2019-04-16 LAB — SARS CORONAVIRUS 2 (TAT 6-24 HRS): SARS Coronavirus 2: POSITIVE — AB

## 2019-04-16 NOTE — Telephone Encounter (Signed)
Patient called stating he wanted his COVID-19 test result from his hospital visit yesterday. He was informed that his test was positive and he can pass the germ to others. He states he has a cough and his chest is tight. He denies breathing problems. Symptoms tier and treatment plan for each was read to patient. Criteria for ending isolation was read to patient .  Good preventative practices were discussed. Patient was told to ask each day "do I feel better same or worse than yesterday". If worse reach out to a HCP.He verbalized understanding of all information. [place] will be notified.

## 2019-05-11 ENCOUNTER — Encounter: Payer: Self-pay | Admitting: Family Medicine

## 2019-07-13 ENCOUNTER — Other Ambulatory Visit: Payer: Self-pay

## 2019-07-13 ENCOUNTER — Ambulatory Visit: Payer: PRIVATE HEALTH INSURANCE | Admitting: Family Medicine

## 2019-07-13 VITALS — BP 128/80 | Temp 97.9°F | Wt 318.4 lb

## 2019-07-13 DIAGNOSIS — M79622 Pain in left upper arm: Secondary | ICD-10-CM | POA: Diagnosis not present

## 2019-07-13 DIAGNOSIS — L918 Other hypertrophic disorders of the skin: Secondary | ICD-10-CM

## 2019-07-13 NOTE — Progress Notes (Signed)
   Subjective:    Patient ID: Frank Ruiz, male    DOB: 04/02/1991, 29 y.o.   MRN: [id number]  HPI Patient comes in today for some swelling under his left arm. Patient states he fell on a boat yesterday and thinks It may have pulled a skin tag. Patient has some discomfort. Patient has an inflamed irritated skin tag.  Swollen.  Somewhat red.  Very large in the past he had wanted to get it removed but never went about doing it  Review of Systems No headache no chest pain no fever    Objective:   Physical Exam  Alert vitals stable, NAD. Blood pressure good on repeat. HEENT normal. Lungs clear. Heart regular rate and rhythm. Partially avulses skin tag left axillary region completely removed dressing applied      Assessment & Plan:  Impression inflamed skin tag plan local measures discussed.  No antibiotics.  Rationale discussed.  Questions answered

## 2019-09-21 ENCOUNTER — Other Ambulatory Visit: Payer: Self-pay | Admitting: Family Medicine

## 2019-11-03 ENCOUNTER — Encounter: Payer: Self-pay | Admitting: Emergency Medicine

## 2019-11-03 ENCOUNTER — Ambulatory Visit
Admission: EM | Admit: 2019-11-03 | Discharge: 2019-11-03 | Disposition: A | Discharge status: Home / Self Care | Payer: PRIVATE HEALTH INSURANCE | Attending: Family Medicine | Admitting: Family Medicine

## 2019-11-03 DIAGNOSIS — H65112 Acute and subacute allergic otitis media (mucoid) (sanguinous) (serous), left ear: Secondary | ICD-10-CM

## 2019-11-03 MED ORDER — FLUTICASONE PROPIONATE 50 MCG/ACT NA SUSP: 1.0000 | Freq: Every day | NASAL | 0 refills | Status: DC

## 2019-11-03 MED ORDER — AMOXICILLIN-POT CLAVULANATE 875-125 MG PO TABS: 1.0000 | ORAL_TABLET | Freq: Two times a day (BID) | ORAL | 0 refills | Status: DC

## 2019-11-03 NOTE — ED Triage Notes (Signed)
Sinus congestion and LT ear pain x 3 days

## 2019-11-03 NOTE — Discharge Instructions (Addendum)
Treating you for an ear infection Start back on the Flonase and continue the allergy medicine daily.  Follow up as needed for continued or worsening symptoms

## 2019-11-04 NOTE — ED Provider Notes (Signed)
[facility]    CSN: [id number] Arrival date & time: 11/03/19  1215      History   Chief Complaint No chief complaint on file.   HPI Alias Frank Ruiz is a 29 y.o. male.   Patient is a 29 year old male that presents today with left ear pain.  This is been constant for 3 days.  He is also had some associated sinus congestion.  No fevers, chills, body aches.  No cough, chest congestion.     Past Medical History:  Diagnosis Date  . Hypertension     Patient Active Problem List   Diagnosis Date Noted  . Essential hypertension 01/08/2018    History reviewed. No pertinent surgical history.     Home Medications    Prior to Admission medications   Medication Sig Start Date End Date Taking? Authorizing Provider  acetaminophen (TYLENOL) 500 MG tablet Take 500 mg by mouth every 6 (six) hours as needed for mild pain or moderate pain.    [provider]  amLODipine (NORVASC) 5 MG tablet TAKE (1) TABLET BY MOUTH AT BEDTIME. 09/21/19   Ladona Ridgel, Malena M, DO  amoxicillin-clavulanate (AUGMENTIN) 875-125 MG tablet Take 1 tablet by mouth every 12 (twelve) hours. 11/03/19   Michella Detjen, Gloris Manchester A, NP  fluticasone (FLONASE) 50 MCG/ACT nasal spray Place 1 spray into both nostrils daily for 14 days. 11/03/19 11/17/19  Dahlia Byes A, NP  ibuprofen (ADVIL) 200 MG tablet Take 800 mg by mouth every 6 (six) hours as needed for mild pain or moderate pain.    [provider]    Family History Family History  Problem Relation Age of Onset  . Healthy Mother   . Healthy Father     Social History Social History   Tobacco Use  . Smoking status: Never Smoker  . Smokeless tobacco: Never Used  Substance Use Topics  . Alcohol use: Not on file  . Drug use: Not on file     Allergies   Patient has no known allergies.   Review of Systems Review of Systems   Physical Exam Triage Vital Signs ED Triage Vitals  Enc Vitals Group     BP 11/03/19 1226 (!) 137/88     Pulse Rate  11/03/19 1226 80     Resp 11/03/19 1226 15     Temp 11/03/19 1226 98.7 F (37.1 C)     Temp Source 11/03/19 1226 Oral     SpO2 11/03/19 1226 97 %     Weight --      Height --      Head Circumference --      Peak Flow --      Pain Score 11/03/19 1237 4     Pain Loc --      Pain Edu? --      Excl. in GC? --    No data found.  Updated Vital Signs BP (!) 137/88 (BP Location: Right Arm)   Pulse 80   Temp 98.7 F (37.1 C) (Oral)   Resp 15   SpO2 97%   Visual Acuity Right Eye Distance:   Left Eye Distance:   Bilateral Distance:    Right Eye Near:   Left Eye Near:    Bilateral Near:     Physical Exam Vitals and nursing note reviewed.  Constitutional:      Appearance: Normal appearance.  HENT:     Head: Normocephalic and atraumatic.     Right Ear: Tympanic membrane normal.  Ears:     Comments: Mucoid TM    Nose: Congestion present.  Eyes:     Conjunctiva/sclera: Conjunctivae normal.  Pulmonary:     Effort: Pulmonary effort is normal.  Musculoskeletal:        General: Normal range of motion.     Cervical back: Normal range of motion.  Skin:    General: Skin is warm and dry.  Neurological:     Mental Status: He is alert.  Psychiatric:        Mood and Affect: Mood normal.      UC Treatments / Results  Labs (all labs ordered are listed, but only abnormal results are displayed) Labs Reviewed - No data to display  EKG   Radiology No results found.  Procedures Procedures (including critical care time)  Medications Ordered in UC Medications - No data to display  Initial Impression / Assessment and Plan / UC Course  I have reviewed the triage vital signs and the nursing notes.  Pertinent labs & imaging results that were available during my care of the patient were reviewed by me and considered in my medical decision making (see chart for details).     Acute mucoid otitis media of left ear. Treating with Augmentin. Flonase nasal spray for nasal  congestion Follow up as needed for continued or worsening symptoms  Final Clinical Impressions(s) / UC Diagnoses   Final diagnoses:  Acute mucoid otitis media of left ear     Discharge Instructions     Treating you for an ear infection Start back on the Flonase and continue the allergy medicine daily.  Follow up as needed for continued or worsening symptoms     ED Prescriptions    Medication Sig Dispense Auth. Provider   fluticasone (FLONASE) 50 MCG/ACT nasal spray Place 1 spray into both nostrils daily for 14 days. 16 g Dacoda Spallone A, NP   amoxicillin-clavulanate (AUGMENTIN) 875-125 MG tablet Take 1 tablet by mouth every 12 (twelve) hours. 14 tablet Ettel Albergo A, NP     PDMP not reviewed this encounter.   Janace Aris, NP 11/04/19 1451

## 2019-11-25 ENCOUNTER — Other Ambulatory Visit: Payer: Self-pay | Admitting: Family Medicine

## 2020-01-28 ENCOUNTER — Other Ambulatory Visit: Payer: Self-pay | Admitting: Family Medicine

## 2020-03-06 ENCOUNTER — Other Ambulatory Visit: Payer: Self-pay | Admitting: Family Medicine

## 2020-03-09 ENCOUNTER — Encounter: Payer: Self-pay | Admitting: Family Medicine

## 2020-03-09 ENCOUNTER — Other Ambulatory Visit: Payer: Self-pay

## 2020-03-09 ENCOUNTER — Ambulatory Visit (INDEPENDENT_AMBULATORY_CARE_PROVIDER_SITE_OTHER): Payer: PRIVATE HEALTH INSURANCE | Admitting: Family Medicine

## 2020-03-09 VITALS — BP 128/88 | HR 98 | Temp 98.2°F | Wt 316.4 lb

## 2020-03-09 DIAGNOSIS — I10 Essential (primary) hypertension: Secondary | ICD-10-CM

## 2020-03-09 DIAGNOSIS — H6692 Otitis media, unspecified, left ear: Secondary | ICD-10-CM

## 2020-03-09 DIAGNOSIS — Z6841 Body Mass Index (BMI) 40.0 and over, adult: Secondary | ICD-10-CM

## 2020-03-09 DIAGNOSIS — Z0000 Encounter for general adult medical examination without abnormal findings: Secondary | ICD-10-CM | POA: Diagnosis not present

## 2020-03-09 MED ORDER — AMOXICILLIN-POT CLAVULANATE 875-125 MG PO TABS: 1.0000 | ORAL_TABLET | Freq: Two times a day (BID) | ORAL | 0 refills | Status: DC

## 2020-03-09 NOTE — Progress Notes (Signed)
Patient ID: Frank Ruiz, male    DOB: [birth year], 29 y.o.   MRN: [id number]   Chief Complaint  Patient presents with  . Annual Exam   Subjective:    HPI  The patient comes in today for a wellness visit.  A review of their health history was completed.  A review of medications was also completed.  Any needed refills; amlodipine  Eating habits: not really  Falls/  MVA accidents in past few months: none  Regular exercise: no  Specialist pt sees on regular basis: none  Preventative health issues were discussed.   Additional concerns: intermittent ear pain of right and left ear over the last couple of days.   Went to ER with covid in 1/21.  Medical History Frank Ruiz has a past medical history of Hypertension.   Outpatient Encounter Medications as of 03/09/2020  Medication Sig  . acetaminophen (TYLENOL) 500 MG tablet Take 500 mg by mouth every 6 (six) hours as needed for mild pain or moderate pain.  Marland Kitchen amLODipine (NORVASC) 5 MG tablet TAKE (1) TABLET BY MOUTH AT BEDTIME.  Marland Kitchen amoxicillin-clavulanate (AUGMENTIN) 875-125 MG tablet Take 1 tablet by mouth 2 (two) times daily.  . fluticasone (FLONASE) 50 MCG/ACT nasal spray Place 1 spray into both nostrils daily for 14 days.  Marland Kitchen ibuprofen (ADVIL) 200 MG tablet Take 800 mg by mouth every 6 (six) hours as needed for mild pain or moderate pain.  . [DISCONTINUED] amoxicillin-clavulanate (AUGMENTIN) 875-125 MG tablet Take 1 tablet by mouth every 12 (twelve) hours.   No facility-administered encounter medications on file as of 03/09/2020.     Review of Systems  Constitutional: Negative for chills and fever.  HENT: Positive for ear pain. Negative for congestion, rhinorrhea and sore throat.   Respiratory: Negative for cough, shortness of breath and wheezing.   Cardiovascular: Negative for chest pain and leg swelling.  Gastrointestinal: Negative for abdominal pain, diarrhea, nausea and vomiting.  Genitourinary: Negative for dysuria and  frequency.  Skin: Negative for rash.  Neurological: Negative for dizziness, weakness and headaches.     Vitals BP 128/88   Pulse 98   Temp 98.2 F (36.8 C)   Wt (!) 316 lb 6.4 oz (143.5 kg)   SpO2 100%   BMI 41.74 kg/m   Objective:   Physical Exam Vitals and nursing note reviewed.  Constitutional:      General: He is not in acute distress.    Appearance: Normal appearance. He is not ill-appearing.  HENT:     Head: Normocephalic.     Right Ear: Tympanic membrane, ear canal and external ear normal.     Left Ear: External ear normal.     Ears:     Comments: +mild erythema in canal and effusion in middle ear.    Nose: Nose normal. No congestion.     Mouth/Throat:     Mouth: Mucous membranes are moist.     Pharynx: No oropharyngeal exudate.  Eyes:     Extraocular Movements: Extraocular movements intact.     Conjunctiva/sclera: Conjunctivae normal.     Pupils: Pupils are equal, round, and reactive to light.  Cardiovascular:     Rate and Rhythm: Normal rate and regular rhythm.     Pulses: Normal pulses.     Heart sounds: Normal heart sounds. No murmur heard.   Pulmonary:     Effort: Pulmonary effort is normal. No respiratory distress.     Breath sounds: Normal breath sounds. No wheezing, rhonchi or rales.  Abdominal:     General: Abdomen is flat. Bowel sounds are normal. There is no distension.     Palpations: Abdomen is soft. There is no mass.     Tenderness: There is no abdominal tenderness. There is no guarding or rebound.     Hernia: No hernia is present.  Musculoskeletal:        General: Normal range of motion.     Right lower leg: No edema.     Left lower leg: No edema.  Skin:    General: Skin is warm and dry.     Findings: No rash.  Neurological:     General: No focal deficit present.     Mental Status: He is alert and oriented to person, place, and time.     Cranial Nerves: No cranial nerve deficit.  Psychiatric:        Mood and Affect: Mood normal.          Behavior: Behavior normal.        Thought Content: Thought content normal.        Judgment: Judgment normal.      Assessment and Plan   1. Well adult exam  2. Chronic otitis media of left ear - amoxicillin-clavulanate (AUGMENTIN) 875-125 MG tablet; Take 1 tablet by mouth 2 (two) times daily.  Dispense: 20 tablet; Refill: 0 - Ambulatory referral to ENT  3. Essential hypertension  4. Class 3 severe obesity due to excess calories without serious comorbidity with body mass index (BMI) of 40.0 to 44.9 in adult [facility])   Chronic otitis media left ear- H/o BMT 3-4 x as child,  2-3 ear infections this year, mostly left.  Recheck bp 128/88 in office.  Dec salt intake and increase in exercising.  Will recheck on next visit. Cont with 5mg  norvasc qhs.  F/u 9mo or prn.- check labs on next visit.

## 2020-03-14 DIAGNOSIS — Z6841 Body Mass Index (BMI) 40.0 and over, adult: Secondary | ICD-10-CM | POA: Insufficient documentation

## 2020-04-10 ENCOUNTER — Other Ambulatory Visit: Payer: Self-pay | Admitting: Family Medicine

## 2020-05-06 ENCOUNTER — Other Ambulatory Visit: Payer: Self-pay

## 2020-05-06 ENCOUNTER — Ambulatory Visit (INDEPENDENT_AMBULATORY_CARE_PROVIDER_SITE_OTHER): Payer: PRIVATE HEALTH INSURANCE | Admitting: Family Medicine

## 2020-05-06 ENCOUNTER — Encounter: Payer: Self-pay | Admitting: Family Medicine

## 2020-05-06 VITALS — BP 138/90 | HR 107 | Temp 97.1°F | Ht 73.0 in | Wt 319.0 lb

## 2020-05-06 DIAGNOSIS — M545 Low back pain, unspecified: Secondary | ICD-10-CM | POA: Diagnosis not present

## 2020-05-06 DIAGNOSIS — S335XXA Sprain of ligaments of lumbar spine, initial encounter: Secondary | ICD-10-CM | POA: Insufficient documentation

## 2020-05-06 MED ORDER — NAPROXEN 500 MG PO TABS: 500.0000 mg | ORAL_TABLET | Freq: Two times a day (BID) | ORAL | 0 refills | Status: DC

## 2020-05-06 MED ORDER — CYCLOBENZAPRINE HCL 10 MG PO TABS: ORAL_TABLET | ORAL | 0 refills | Status: DC

## 2020-05-06 NOTE — Progress Notes (Signed)
Patient ID: Frank Ruiz, male    DOB: 1990-07-25, 30 y.o.   MRN: [id number]   Chief Complaint  Patient presents with  . Back Pain   Subjective:  CC: low back pain  This is a new problem.  Resents today for an acute visit with a complaint of of low back pain.  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  Reports pain is off and on.  Severity when it is at its worst is 10/10.  Denies any numbness, tingling, weakness, saddle paresthesia, bowel or bladder involvement.  Denies fever, chills, chest pain, shortness of breath.  Has tried ibuprofen which has not relieved his pain completely.   mid low back pain. Picked up a bag of leaves 2 or 3 days ago and felt pain in lower back as soon has he picked up the bag. Tried ibuprofen.    Medical History Abrham has a past medical history of Hypertension.   Outpatient Encounter Medications as of 05/06/2020  Medication Sig  . amLODipine (NORVASC) 5 MG tablet TAKE (1) TABLET BY MOUTH AT BEDTIME.  . cyclobenzaprine (FLEXERIL) 10 MG tablet Take one tablet by mouth at night. Do not drive while taking.  . naproxen (NAPROSYN) 500 MG tablet Take 1 tablet (500 mg total) by mouth 2 (two) times daily with a meal.  . [DISCONTINUED] acetaminophen (TYLENOL) 500 MG tablet Take 500 mg by mouth every 6 (six) hours as needed for mild pain or moderate pain.  . [DISCONTINUED] ibuprofen (ADVIL) 200 MG tablet Take 800 mg by mouth every 6 (six) hours as needed for mild pain or moderate pain.  . [DISCONTINUED] amoxicillin-clavulanate (AUGMENTIN) 875-125 MG tablet Take 1 tablet by mouth 2 (two) times daily.  . [DISCONTINUED] fluticasone (FLONASE) 50 MCG/ACT nasal spray Place 1 spray into both nostrils daily for 14 days.   No facility-administered encounter medications on file as of 05/06/2020.     Review of Systems  Constitutional: Negative for chills and fever.  HENT: Negative for  congestion.   Respiratory: Negative for cough, chest tightness and shortness of breath.   Cardiovascular: Negative for chest pain, palpitations and leg swelling.  Gastrointestinal: Negative for abdominal pain, diarrhea, nausea and vomiting.  Musculoskeletal: Positive for back pain and gait problem. Negative for neck pain and neck stiffness.  Neurological: Negative for weakness, numbness and headaches.     Vitals BP 138/90   Pulse (!) 107   Temp (!) 97.1 F (36.2 C)   Ht 6\' 1"  (1.854 m)   Wt (!) 319 lb (144.7 kg)   SpO2 98%   BMI 42.09 kg/m   Objective:   Physical Exam Vitals reviewed.  Cardiovascular:     Rate and Rhythm: Normal rate and regular rhythm.     Heart sounds: Normal heart sounds.  Pulmonary:     Effort: Pulmonary effort is normal.     Breath sounds: Normal breath sounds.  Abdominal:     General: Bowel sounds are normal.  Skin:    General: Skin is warm and dry.  Neurological:     General: No focal deficit present.     Mental Status: He is alert.     Cranial Nerves: No cranial nerve deficit.     Motor: No weakness.     Coordination: Romberg sign negative. Coordination normal. Finger-Nose-Finger Test and Heel to [facility] Test normal.     Gait: Gait normal.  Comments: 5/5 upper and lower body strength. Negative straight leg raise.   Psychiatric:        Behavior: Behavior normal.      Assessment and Plan   1. Lumbar sprain, initial encounter - naproxen (NAPROSYN) 500 MG tablet; Take 1 tablet (500 mg total) by mouth 2 (two) times daily with a meal.  Dispense: 30 tablet; Refill: 0 - cyclobenzaprine (FLEXERIL) 10 MG tablet; Take one tablet by mouth at night. Do not drive while taking.  Dispense: 10 tablet; Refill: 0   Neuro exam negative in the office today.  Will treat with conservative measures.  He will take naproxen twice per day with food and muscle relaxants at bedtime.  He is instructed to use ice and heat 20 minutes every 1-2 hours as his schedule  allows.  He is instructed that most back pain will resolve on its own in 6 to 8 weeks.  Denies the need to be out of work.  Agrees with plan of care discussed today. Understands warning signs to seek further care: chest pain, shortness of breath, any significant change in health.  Understands to follow-up in 1 month, sooner if needed.  If pain completely resolves, he can cancel this appointment.  If pain is persistent, will consider orthopedic referral at that time.    Dorena Bodo, NP 05/06/2020

## 2020-05-06 NOTE — Patient Instructions (Signed)
Take Naproxen twice per day with food. Try alternating ice with heating pad for 20 minutes every 1-2 hours  Muscle relaxer at night.      Back Exercises These exercises help to make your trunk and back strong. They also help to keep the lower back flexible. Doing these exercises can help to prevent back pain or lessen existing pain.  If you have back pain, try to do these exercises 2-3 times each day or as told by your doctor.  As you get better, do the exercises once each day. Repeat the exercises more often as told by your doctor.  To stop back pain from coming back, do the exercises once each day, or as told by your doctor. Exercises Single knee to chest Do these steps 3-5 times in a row for each leg: 1. Lie on your back on a firm bed or the floor with your legs stretched out. 2. Bring one knee to your chest. 3. Grab your knee or thigh with both hands and hold them it in place. 4. Pull on your knee until you feel a gentle stretch in your lower back or buttocks. 5. Keep doing the stretch for 10-30 seconds. 6. Slowly let go of your leg and straighten it. Pelvic tilt Do these steps 5-10 times in a row: 1. Lie on your back on a firm bed or the floor with your legs stretched out. 2. Bend your knees so they point up to the ceiling. Your feet should be flat on the floor. 3. Tighten your lower belly (abdomen) muscles to press your lower back against the floor. This will make your tailbone point up to the ceiling instead of pointing down to your feet or the floor. 4. Stay in this position for 5-10 seconds while you gently tighten your muscles and breathe evenly. Cat-cow Do these steps until your lower back bends more easily: 1. Get on your hands and knees on a firm surface. Keep your hands under your shoulders, and keep your knees under your hips. You may put padding under your knees. 2. Let your head hang down toward your chest. Tighten (contract) the muscles in your belly. Point your  tailbone toward the floor so your lower back becomes rounded like the back of a cat. 3. Stay in this position for 5 seconds. 4. Slowly lift your head. Let the muscles of your belly relax. Point your tailbone up toward the ceiling so your back forms a sagging arch like the back of a cow. 5. Stay in this position for 5 seconds.   Press-ups Do these steps 5-10 times in a row: 1. Lie on your belly (face-down) on the floor. 2. Place your hands near your head, about shoulder-width apart. 3. While you keep your back relaxed and keep your hips on the floor, slowly straighten your arms to raise the top half of your body and lift your shoulders. Do not use your back muscles. You may change where you place your hands in order to make yourself more comfortable. 4. Stay in this position for 5 seconds. 5. Slowly return to lying flat on the floor.   Bridges Do these steps 10 times in a row: 1. Lie on your back on a firm surface. 2. Bend your knees so they point up to the ceiling. Your feet should be flat on the floor. Your arms should be flat at your sides, next to your body. 3. Tighten your butt muscles and lift your butt off the floor until  your waist is almost as high as your knees. If you do not feel the muscles working in your butt and the back of your thighs, slide your feet 1-2 inches farther away from your butt. 4. Stay in this position for 3-5 seconds. 5. Slowly lower your butt to the floor, and let your butt muscles relax. If this exercise is too easy, try doing it with your arms crossed over your chest.   Belly crunches Do these steps 5-10 times in a row: 1. Lie on your back on a firm bed or the floor with your legs stretched out. 2. Bend your knees so they point up to the ceiling. Your feet should be flat on the floor. 3. Cross your arms over your chest. 4. Tip your chin a little bit toward your chest but do not bend your neck. 5. Tighten your belly muscles and slowly raise your chest just  enough to lift your shoulder blades a tiny bit off of the floor. Avoid raising your body higher than that, because it can put too much stress on your low back. 6. Slowly lower your chest and your head to the floor. Back lifts Do these steps 5-10 times in a row: 1. Lie on your belly (face-down) with your arms at your sides, and rest your forehead on the floor. 2. Tighten the muscles in your legs and your butt. 3. Slowly lift your chest off of the floor while you keep your hips on the floor. Keep the back of your head in line with the curve in your back. Look at the floor while you do this. 4. Stay in this position for 3-5 seconds. 5. Slowly lower your chest and your face to the floor. Contact a doctor if:  Your back pain gets a lot worse when you do an exercise.  Your back pain does not get better 2 hours after you exercise. If you have any of these problems, stop doing the exercises. Do not do them again unless your doctor says it is okay. Get help right away if:  You have sudden, very bad back pain. If this happens, stop doing the exercises. Do not do them again unless your doctor says it is okay. This information is not intended to replace advice given to you by your health care provider. Make sure you discuss any questions you have with your health care provider. Document Revised: 12/19/2017 Document Reviewed: 12/19/2017 Elsevier Patient Education  2021 [employer].

## 2020-06-06 ENCOUNTER — Encounter: Payer: PRIVATE HEALTH INSURANCE | Admitting: Family Medicine

## 2020-06-06 ENCOUNTER — Encounter: Payer: Self-pay | Admitting: Family Medicine

## 2020-07-13 IMAGING — DX DG CHEST 1V PORT
1 series · 1 of 1 positions shown · non-contrast
Comparison: None.

CLINICAL DATA: Chest pain and shortness of breath, recent exposure
to [id number]-PA

EXAM:
PORTABLE CHEST 1 VIEW

[chest ap grid]
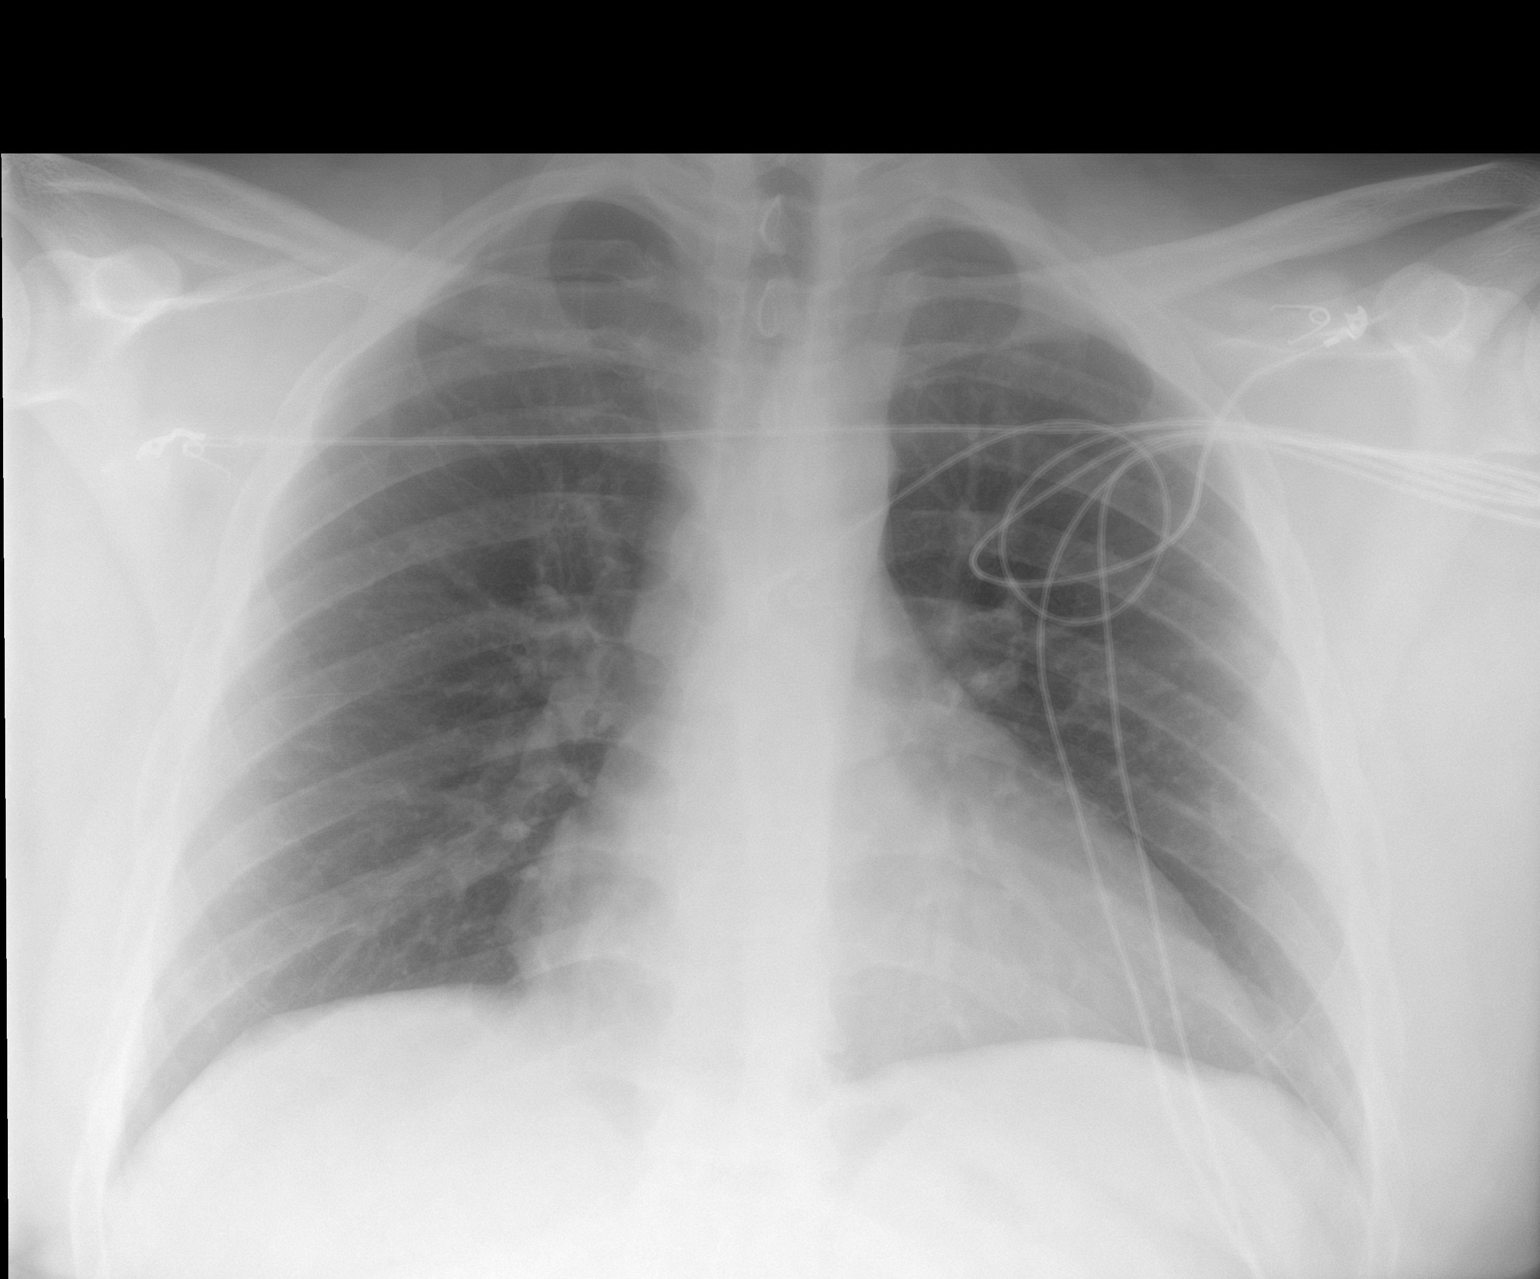

[1 of 1 positions shown; findings below may reference images not displayed]

FINDINGS: Cardiac shadows within normal limits. The lungs are well aerated
bilaterally. No focal infiltrate or sizable effusion is seen.
IMPRESSION: No acute abnormality noted.

## 2020-08-23 ENCOUNTER — Encounter: Payer: PRIVATE HEALTH INSURANCE | Admitting: Family Medicine

## 2020-08-31 ENCOUNTER — Other Ambulatory Visit: Payer: Self-pay | Admitting: Family Medicine

## 2020-09-07 ENCOUNTER — Encounter: Payer: Self-pay | Admitting: Family Medicine

## 2020-09-07 ENCOUNTER — Other Ambulatory Visit: Payer: Self-pay

## 2020-09-07 ENCOUNTER — Ambulatory Visit (INDEPENDENT_AMBULATORY_CARE_PROVIDER_SITE_OTHER): Payer: PRIVATE HEALTH INSURANCE | Admitting: Family Medicine

## 2020-09-07 VITALS — BP 134/82 | HR 108 | Temp 95.4°F | Ht 73.0 in | Wt 322.4 lb

## 2020-09-07 DIAGNOSIS — R7989 Other specified abnormal findings of blood chemistry: Secondary | ICD-10-CM

## 2020-09-07 DIAGNOSIS — I10 Essential (primary) hypertension: Secondary | ICD-10-CM

## 2020-09-07 DIAGNOSIS — H66003 Acute suppurative otitis media without spontaneous rupture of ear drum, bilateral: Secondary | ICD-10-CM | POA: Diagnosis not present

## 2020-09-07 MED ORDER — AMOXICILLIN 500 MG PO CAPS: 500.0000 mg | ORAL_CAPSULE | Freq: Three times a day (TID) | ORAL | 0 refills | Status: AC

## 2020-09-07 NOTE — Patient Instructions (Signed)
Get labs this week, non fasting

## 2020-09-07 NOTE — Progress Notes (Signed)
Patient ID: Daveyon Kitchings, male    DOB: 05/08/1990, 30 y.o.   MRN: [id number]   Chief Complaint  Patient presents with   Hypertension   Subjective:    HPI Pt here for follow up on HTN. Pt states no issues. Pt takes bp meds at night.  Pt does not check blood pressure outside of office.   HTN Pt compliant with BP meds.  No SEs Denies chest pain, sob, LE swelling, or blurry vision.   Medical History Criston has a past medical history of Hypertension.   Outpatient Encounter Medications as of 09/07/2020  Medication Sig   amLODipine (NORVASC) 5 MG tablet TAKE (1) TABLET BY MOUTH AT BEDTIME.   [EXPIRED] amoxicillin (AMOXIL) 500 MG capsule Take 1 capsule (500 mg total) by mouth 3 (three) times daily for 10 days.   [DISCONTINUED] cyclobenzaprine (FLEXERIL) 10 MG tablet Take one tablet by mouth at night. Do not drive while taking.   [DISCONTINUED] naproxen (NAPROSYN) 500 MG tablet Take 1 tablet (500 mg total) by mouth 2 (two) times daily with a meal.   No facility-administered encounter medications on file as of 09/07/2020.     Review of Systems  Constitutional:  Negative for chills and fever.  HENT:  Negative for congestion, rhinorrhea and sore throat.   Respiratory:  Negative for cough, shortness of breath and wheezing.   Cardiovascular:  Negative for chest pain and leg swelling.  Gastrointestinal:  Negative for abdominal pain, diarrhea, nausea and vomiting.  Genitourinary:  Negative for dysuria and frequency.  Skin:  Negative for rash.  Neurological:  Negative for dizziness, weakness and headaches.    Vitals BP 134/82   Pulse (!) 108   Temp (!) 95.4 F (35.2 C)   Ht '6\' 1"'  (1.854 m)   Wt (!) 322 lb 6.4 oz (146.2 kg)   SpO2 98%   BMI 42.54 kg/m   Objective:   Physical Exam Vitals and nursing note reviewed.  Constitutional:      General: He is not in acute distress.    Appearance: Normal appearance. He is not ill-appearing.  Cardiovascular:     Rate and Rhythm: Regular  rhythm. Tachycardia present.     Pulses: Normal pulses.     Heart sounds: Normal heart sounds.  Pulmonary:     Effort: Pulmonary effort is normal. No respiratory distress.     Breath sounds: Normal breath sounds.  Musculoskeletal:        General: Normal range of motion.  Skin:    General: Skin is warm and dry.     Findings: No rash.  Neurological:     General: No focal deficit present.     Mental Status: He is alert and oriented to person, place, and time.  Psychiatric:        Mood and Affect: Mood normal.        Behavior: Behavior normal.        Thought Content: Thought content normal.        Judgment: Judgment normal.     Assessment and Plan   1. Essential hypertension - CMP14+EGFR  2. Elevated serum creatinine - CMP14+EGFR  3. Non-recurrent acute suppurative otitis media of both ears without spontaneous rupture of tympanic membranes - amoxicillin (AMOXIL) 500 MG capsule; Take 1 capsule (500 mg total) by mouth 3 (three) times daily for 10 days.  Dispense: 30 capsule; Refill: 0   Labs in the next week, and will refill after labs.  Om bilateral- amoxicillin, flonase, tylenol/ibuprofen,  if not improving call or rto.  Pt in agreement.   Htn- stable. Cont meds.  Elevated Cr- at 1.27, in 1/21.  Pt to get labs to recheck.  Return in about 6 months (around 03/09/2021) for f/u htn.

## 2020-10-06 ENCOUNTER — Other Ambulatory Visit: Payer: Self-pay | Admitting: Family Medicine

## 2020-10-06 DIAGNOSIS — I10 Essential (primary) hypertension: Secondary | ICD-10-CM

## 2020-10-06 DIAGNOSIS — Z6841 Body Mass Index (BMI) 40.0 and over, adult: Secondary | ICD-10-CM

## 2020-10-06 NOTE — Telephone Encounter (Signed)
Called pt because note states to do bw and then will refill med. He states he will do bw tomorrow. He has one pill left for tonight and then will be out.

## 2020-10-07 ENCOUNTER — Telehealth: Payer: Self-pay | Admitting: Family Medicine

## 2020-10-07 NOTE — Telephone Encounter (Signed)
Pt is going to have labs done. Pt was last seen 09/07/20 and was told to follow up in 6 months...please advise.? Thank you

## 2020-10-07 NOTE — Addendum Note (Signed)
Addended by: Annalee Genta on: 10/07/2020 05:16 PM   Modules accepted: Orders

## 2020-10-08 LAB — CMP14+EGFR
ALT: 34 IU/L (ref 0–44)
AST: 20 IU/L (ref 0–40)
Albumin/Globulin Ratio: 1.7 (ref 1.2–2.2)
Albumin: 4.8 g/dL (ref 4.1–5.2)
Alkaline Phosphatase: 72 IU/L (ref 44–121)
BUN/Creatinine Ratio: 12 (ref 9–20)
BUN: 13 mg/dL (ref 6–20)
Bilirubin Total: 0.3 mg/dL (ref 0.0–1.2)
CO2: 22 mmol/L (ref 20–29)
Calcium: 9.7 mg/dL (ref 8.7–10.2)
Chloride: 104 mmol/L (ref 96–106)
Creatinine, Ser: 1.09 mg/dL (ref 0.76–1.27)
Globulin, Total: 2.8 g/dL (ref 1.5–4.5)
Glucose: 114 mg/dL — ABNORMAL HIGH (ref 65–99)
Potassium: 4.3 mmol/L (ref 3.5–5.2)
Sodium: 142 mmol/L (ref 134–144)
Total Protein: 7.6 g/dL (ref 6.0–8.5)
eGFR: 95 mL/min/{1.73_m2} (ref >59)

## 2020-10-08 MED ORDER — AMLODIPINE BESYLATE 5 MG PO TABS: 5.0000 mg | ORAL_TABLET | Freq: Every day | ORAL | 1 refills | Status: DC

## 2020-10-08 NOTE — Addendum Note (Signed)
Addended by: Annalee Genta on: 10/08/2020 09:01 AM   Modules accepted: Orders

## 2020-10-11 NOTE — Telephone Encounter (Signed)
Patient notified and verbalized understanding. 

## 2020-10-11 NOTE — Telephone Encounter (Signed)
His labs were reviewed and message sent.  Refilled norvasc for 90 days.  Thx. Dr. Ladona Ridgel

## 2020-10-12 NOTE — Telephone Encounter (Signed)
Called lab because cmp is final but other test are not. Lab only saw cmp and did see other orders. Lipid can be added but a1c and cbc cannot be added. Pt will need to return to lab to have blood drawn for a1c and cbc

## 2020-10-13 NOTE — Telephone Encounter (Signed)
Lipid was added.

## 2020-10-14 ENCOUNTER — Other Ambulatory Visit: Payer: Self-pay

## 2020-10-14 ENCOUNTER — Encounter: Payer: Self-pay | Admitting: Family Medicine

## 2020-10-14 DIAGNOSIS — Z6841 Body Mass Index (BMI) 40.0 and over, adult: Secondary | ICD-10-CM

## 2020-10-14 DIAGNOSIS — E781 Pure hyperglyceridemia: Secondary | ICD-10-CM

## 2020-10-27 LAB — SPECIMEN STATUS REPORT

## 2020-11-01 LAB — LIPID PANEL W/O CHOL/HDL RATIO
Cholesterol, Total: 179 mg/dL (ref 100–199)
HDL: 32 mg/dL — ABNORMAL LOW (ref >39)
LDL Chol Calc (NIH): 91 mg/dL (ref 0–99)
Triglycerides: 335 mg/dL — ABNORMAL HIGH (ref 0–149)
VLDL Cholesterol Cal: 56 mg/dL — ABNORMAL HIGH (ref 5–40)

## 2020-11-01 LAB — SPECIMEN STATUS REPORT

## 2020-11-03 ENCOUNTER — Telehealth: Payer: Self-pay

## 2020-11-03 NOTE — Telephone Encounter (Signed)
Verified with patient in regards to his lab results, patient states he does not drink alcohol and says he ate lunch that day. He will have the a1c and cbc drawn as soon as he gets some time off work.

## 2021-01-23 ENCOUNTER — Telehealth: Payer: Self-pay | Admitting: Family Medicine

## 2021-01-23 DIAGNOSIS — Z79899 Other long term (current) drug therapy: Secondary | ICD-10-CM

## 2021-01-23 DIAGNOSIS — E781 Pure hyperglyceridemia: Secondary | ICD-10-CM

## 2021-01-23 DIAGNOSIS — I10 Essential (primary) hypertension: Secondary | ICD-10-CM

## 2021-01-23 NOTE — Telephone Encounter (Signed)
Lab orders placed and pt is aware 

## 2021-01-23 NOTE — Telephone Encounter (Signed)
Labs active from 10/14/20 A1C,CBC; also has active labs from 10/07/20 LIPID, A1C, CMP14+EGFR, CBC. Please advise as to which ones you would like. [facility] only sees most recent. Thank you!

## 2021-01-23 NOTE — Telephone Encounter (Signed)
Patient has physical on 12/2 and needing labs done

## 2021-01-23 NOTE — Telephone Encounter (Signed)
Okay to order labs: CBC, CMP+GFR, A1C, Lipid.  Thank you  Dr. Adriana Simas

## 2021-03-10 ENCOUNTER — Encounter: Payer: Self-pay | Admitting: Family Medicine

## 2021-03-10 ENCOUNTER — Ambulatory Visit (INDEPENDENT_AMBULATORY_CARE_PROVIDER_SITE_OTHER): Payer: No Typology Code available for payment source | Admitting: Family Medicine

## 2021-03-10 ENCOUNTER — Other Ambulatory Visit: Payer: Self-pay

## 2021-03-10 VITALS — BP 137/94 | HR 91 | Temp 98.1°F | Ht 73.0 in | Wt 322.0 lb

## 2021-03-10 DIAGNOSIS — E781 Pure hyperglyceridemia: Secondary | ICD-10-CM

## 2021-03-10 DIAGNOSIS — Z0000 Encounter for general adult medical examination without abnormal findings: Secondary | ICD-10-CM

## 2021-03-10 DIAGNOSIS — H9191 Unspecified hearing loss, right ear: Secondary | ICD-10-CM

## 2021-03-10 DIAGNOSIS — R7309 Other abnormal glucose: Secondary | ICD-10-CM

## 2021-03-10 DIAGNOSIS — E66813 Obesity, class 3: Secondary | ICD-10-CM

## 2021-03-10 DIAGNOSIS — I10 Essential (primary) hypertension: Secondary | ICD-10-CM | POA: Diagnosis not present

## 2021-03-10 DIAGNOSIS — Z6841 Body Mass Index (BMI) 40.0 and over, adult: Secondary | ICD-10-CM

## 2021-03-10 DIAGNOSIS — D72819 Decreased white blood cell count, unspecified: Secondary | ICD-10-CM

## 2021-03-10 MED ORDER — AMLODIPINE BESYLATE 5 MG PO TABS: 5.0000 mg | ORAL_TABLET | Freq: Every day | ORAL | 3 refills | Status: DC

## 2021-03-10 NOTE — Assessment & Plan Note (Signed)
Preventative health care was updated today.  Patient declines influenza vaccine, hepatitis C screening, HIV screening. Advised to watch diet.  Recommended exercise.  Patient needs weight loss. Laboratory studies today.

## 2021-03-10 NOTE — Patient Instructions (Signed)
Continue your medication.  Labs today.  I will place referral to ENT.  Follow up in 6 months to 1 year.   Take care  Dr. Adriana Simas

## 2021-03-10 NOTE — Progress Notes (Signed)
Subjective:  Patient ID: Frank Ruiz, male    DOB: 1990-09-21  Age: 30 y.o. MRN: [id number]  CC: Physical  HPI Ac Frank Ruiz is a 30 y.o. male with a past medical history of obesity, hypertriglyceridemia, and hypertension presents to the clinic today for an annual physical.  Patient states that he is doing well.  Patient does note that he has had ongoing hearing difficulty in the right ear.  Had a previous bout of tinnitus which lasted quite some time but subsequently resolved.  He states that his ear feels clogged/muffled. He has a history of recurrent otitis media and tympanostomy tubes.  Preventative Healthcare Immunizations Tetanus - Up to date. Pneumococcal - Not indicated. Flu - Declines. Hepatitis C screening - Declines. Labs: Labs today. Exercise: No exercise.  Alcohol use: No. Smoking/tobacco use: No. STD/HIV testing: Declines.  PMH, Surgical Hx, Family Hx, Social History reviewed and updated as below.  Past Medical History:  Diagnosis Date   Hypertension    No past surgical history on file. Family History  Problem Relation Age of Onset   Healthy Mother    Healthy Father    Social History   Tobacco Use   Smoking status: Never   Smokeless tobacco: Never  Substance Use Topics   Alcohol use: Not on file    Review of Systems  Constitutional: Negative.   HENT:  Positive for hearing loss.   Respiratory: Negative.    Cardiovascular: Negative.    Objective:   Today's Vitals: BP (!) 137/94   Pulse 91   Temp 98.1 F (36.7 C)   Ht '6\' 1"'  (1.854 m)   Wt (!) 322 lb (146.1 kg)   SpO2 97%   BMI 42.48 kg/m   Physical Exam Vitals and nursing note reviewed.  Constitutional:      General: He is not in acute distress.    Appearance: Normal appearance. He is obese. He is not ill-appearing.  HENT:     Head: Normocephalic and atraumatic.     Nose: Nose normal.  Eyes:     General:        Right eye: No discharge.        Left eye: No discharge.      Conjunctiva/sclera: Conjunctivae normal.  Cardiovascular:     Rate and Rhythm: Normal rate and regular rhythm.     Heart sounds: No murmur heard. Pulmonary:     Effort: Pulmonary effort is normal.     Breath sounds: Normal breath sounds. No wheezing or rales.  Abdominal:     General: There is no distension.     Palpations: Abdomen is soft.     Tenderness: There is no abdominal tenderness.  Skin:    General: Skin is warm.     Findings: No rash.  Neurological:     Mental Status: He is alert.  Psychiatric:        Mood and Affect: Mood normal.        Behavior: Behavior normal.    Assessment & Plan:   Problem List Items Addressed This Visit       Cardiovascular and Mediastinum   Essential hypertension   Relevant Medications   amLODipine (NORVASC) 5 MG tablet     Other   Class 3 severe obesity due to excess calories without serious comorbidity with body mass index (BMI) of 40.0 to 44.9 in adult [facility])   Relevant Orders   CMP14+EGFR   Hemoglobin A1c   Hypertriglyceridemia   Relevant Medications   amLODipine (  NORVASC) 5 MG tablet   Other Relevant Orders   Lipid panel   Annual physical exam - Primary    Preventative health care was updated today.  Patient declines influenza vaccine, hepatitis C screening, HIV screening. Advised to watch diet.  Recommended exercise.  Patient needs weight loss. Laboratory studies today.      Other Visit Diagnoses     Elevated glucose       Relevant Orders   CMP14+EGFR   Hemoglobin A1c   Leukopenia, unspecified type       Relevant Orders   CBC   Hearing loss of right ear, unspecified hearing loss type       Relevant Orders   Ambulatory referral to ENT       Meds ordered this encounter  Medications   amLODipine (NORVASC) 5 MG tablet    Sig: Take 1 tablet (5 mg total) by mouth daily.    Dispense:  90 tablet    Refill:  3    Follow-up: Return for 6 months to 1 year, HTN follow up.  [place]

## 2021-03-11 LAB — LIPID PANEL
Chol/HDL Ratio: 5 ratio (ref 0.0–5.0)
Cholesterol, Total: 181 mg/dL (ref 100–199)
HDL: 36 mg/dL — ABNORMAL LOW (ref >39)
LDL Chol Calc (NIH): 122 mg/dL — ABNORMAL HIGH (ref 0–99)
Triglycerides: 125 mg/dL (ref 0–149)
VLDL Cholesterol Cal: 23 mg/dL (ref 5–40)

## 2021-03-11 LAB — CMP14+EGFR
ALT: 42 IU/L (ref 0–44)
AST: 26 IU/L (ref 0–40)
Albumin/Globulin Ratio: 1.9 (ref 1.2–2.2)
Albumin: 5.1 g/dL (ref 4.1–5.2)
Alkaline Phosphatase: 76 IU/L (ref 44–121)
BUN/Creatinine Ratio: 11 (ref 9–20)
BUN: 12 mg/dL (ref 6–20)
Bilirubin Total: 0.5 mg/dL (ref 0.0–1.2)
CO2: 23 mmol/L (ref 20–29)
Calcium: 9.6 mg/dL (ref 8.7–10.2)
Chloride: 104 mmol/L (ref 96–106)
Creatinine, Ser: 1.11 mg/dL (ref 0.76–1.27)
Globulin, Total: 2.7 g/dL (ref 1.5–4.5)
Glucose: 102 mg/dL — ABNORMAL HIGH (ref 70–99)
Potassium: 4.4 mmol/L (ref 3.5–5.2)
Sodium: 142 mmol/L (ref 134–144)
Total Protein: 7.8 g/dL (ref 6.0–8.5)
eGFR: 93 mL/min/{1.73_m2} (ref >59)

## 2021-03-11 LAB — CBC
Hematocrit: 45.5 % (ref 37.5–51.0)
Hemoglobin: 15.8 g/dL (ref 13.0–17.7)
MCH: 28.1 pg (ref 26.6–33.0)
MCHC: 34.7 g/dL (ref 31.5–35.7)
MCV: 81 fL (ref 79–97)
Platelets: 270 10*3/uL (ref 150–450)
RBC: 5.62 x10E6/uL (ref 4.14–5.80)
RDW: 12.2 % (ref 11.6–15.4)
WBC: 6.4 10*3/uL (ref 3.4–10.8)

## 2021-03-11 LAB — HEMOGLOBIN A1C
Est. average glucose Bld gHb Est-mCnc: 134 mg/dL
Hgb A1c MFr Bld: 6.3 % — ABNORMAL HIGH (ref 4.8–5.6)

## 2021-07-07 ENCOUNTER — Ambulatory Visit (INDEPENDENT_AMBULATORY_CARE_PROVIDER_SITE_OTHER): Payer: PRIVATE HEALTH INSURANCE | Admitting: Nurse Practitioner

## 2021-07-07 ENCOUNTER — Encounter: Payer: Self-pay | Admitting: Nurse Practitioner

## 2021-07-07 VITALS — BP 148/94 | HR 70 | Temp 98.8°F | Ht 73.0 in | Wt 318.0 lb

## 2021-07-07 DIAGNOSIS — J310 Chronic rhinitis: Secondary | ICD-10-CM | POA: Diagnosis not present

## 2021-07-07 DIAGNOSIS — K219 Gastro-esophageal reflux disease without esophagitis: Secondary | ICD-10-CM | POA: Insufficient documentation

## 2021-07-07 DIAGNOSIS — H66003 Acute suppurative otitis media without spontaneous rupture of ear drum, bilateral: Secondary | ICD-10-CM | POA: Insufficient documentation

## 2021-07-07 MED ORDER — FLUTICASONE PROPIONATE 50 MCG/ACT NA SUSP: 2.0000 | Freq: Every day | NASAL | 5 refills | Status: DC

## 2021-07-07 MED ORDER — AMOXICILLIN-POT CLAVULANATE 875-125 MG PO TABS: 1.0000 | ORAL_TABLET | Freq: Two times a day (BID) | ORAL | 0 refills | Status: DC

## 2021-07-07 MED ORDER — FEXOFENADINE HCL 180 MG PO TABS: 180.0000 mg | ORAL_TABLET | Freq: Every day | ORAL | 5 refills | Status: DC

## 2021-07-07 NOTE — Progress Notes (Addendum)
? ?  Subjective:  ? ? Patient ID: Frank Ruiz, male    DOB: 18-May-1990, 31 y.o.   MRN: [id number] ? ?HPI ?Ear pain bilateral , sinus drainage and pressure x 3 days  ?Symptoms started after working outside in the yard  ?Itchy throat which has improved.  No fever.  Slight cough.  No wheezing.  Has had some mild upper midsternal chest pain only at nighttime.  Unassociated with activity.  Non-smoker, no tobacco use.  Denies any alcohol use.  Drinks 3-4 servings of green tea per day and coffee in the morning.  Takes OTC NSAIDs about once a week.  Diet low in citrusy or spicy foods.  Has not identified any specific foods triggering his symptoms.  Has had some mild reflux symptoms, uses an OTC acid reducer at times, does not know the name.  No nausea or vomiting.  No abdominal pain.  Bowels normal limit. ?States he has a history of seasonal allergies.  Also has a history of recurrent otitis from a couple of years ago. ? ? ? ?   ?Objective:  ? Physical Exam ?NAD.  Alert, oriented.  TMs dull with significant erythema more on the left.  Nares clear.  Pharynx injected with PND noted.  Neck supple with mild soft anterior adenopathy.  Lungs clear.  Heart regular rate rhythm.  Abdomen soft obese nondistended nontender without obvious masses. ? ?Today's Vitals  ? 07/07/21 1609  ?BP: (!) 148/94  ?Pulse: 70  ?Temp: 98.8 ?F (37.1 ?C)  ?SpO2: 99%  ?Weight: (!) 318 lb (144.2 kg)  ?Height: 6\' 1"  (1.854 m)  ? ?Body mass index is 41.96 kg/m?. ? ? ? ? ?   ?Assessment & Plan:  ? ?Problem List Items Addressed This Visit   ? ?  ? Respiratory  ? Mixed rhinitis  ?  ? Digestive  ? Gastroesophageal reflux disease without esophagitis  ?  ? Nervous and Auditory  ? Non-recurrent acute suppurative otitis media of both ears without spontaneous rupture of tympanic membranes - Primary  ? Relevant Medications  ? amoxicillin-clavulanate (AUGMENTIN) 875-125 MG tablet  ? ?Meds ordered this encounter  ?Medications  ? amoxicillin-clavulanate (AUGMENTIN) 875-125 MG  tablet  ?  Sig: Take 1 tablet by mouth 2 (two) times daily.  ?  Dispense:  20 tablet  ?  Refill:  0  ?  Order Specific Question:   Supervising Provider  ?  Answer:   A [[id number]]  ? fluticasone (FLONASE) 50 MCG/ACT nasal spray  ?  Sig: Place 2 sprays into both nostrils daily.  ?  Dispense:  16 g  ?  Refill:  5  ?  Order Specific Question:   Supervising Provider  ?  Answer:   Lilyan Punt A [[id number]]  ? fexofenadine (ALLEGRA) 180 MG tablet  ?  Sig: Take 1 tablet (180 mg total) by mouth daily. Prn allergies  ?  Dispense:  30 tablet  ?  Refill:  5  ?  Order Specific Question:   Supervising Provider  ?  Answer:   Lilyan Punt A [[id number]]  ? ?Call back in 4 to 5 days if no improvement, sooner if worse.  If patient continues to have significant issues with his ears, recommend he consider an ENT referral for evaluation. ?Discussed lifestyle factors affecting his reflux symptoms.  Continue OTC meds as directed.  Recheck if symptoms worsen or persist. ? ? ?

## 2021-08-31 ENCOUNTER — Ambulatory Visit (INDEPENDENT_AMBULATORY_CARE_PROVIDER_SITE_OTHER): Payer: PRIVATE HEALTH INSURANCE | Admitting: Family Medicine

## 2021-08-31 DIAGNOSIS — H6502 Acute serous otitis media, left ear: Secondary | ICD-10-CM | POA: Diagnosis not present

## 2021-08-31 MED ORDER — AMOXICILLIN-POT CLAVULANATE 875-125 MG PO TABS: 1.0000 | ORAL_TABLET | Freq: Two times a day (BID) | ORAL | 0 refills | Status: DC

## 2021-08-31 NOTE — Patient Instructions (Signed)
Antibiotic as prescribed.  Take care  Dr. Kiernan Farkas  

## 2021-09-04 DIAGNOSIS — H6690 Otitis media, unspecified, unspecified ear: Secondary | ICD-10-CM | POA: Insufficient documentation

## 2021-09-04 NOTE — Progress Notes (Signed)
Subjective:  Patient ID: Frank Ruiz, male    DOB: April 08, 1991  Age: 31 y.o. MRN: [id number]  CC: Chief Complaint  Patient presents with   Sinus Problem   Ear Pain    Left ear hurts and right ear has pressure    HPI:  31 year old male presents for evaluation of the above.  Symptoms started on Saturday. Reports sinus congestion, productive cough, and left ear pain. No fever. No sick contacts. No relieving factors. No other complaints or concerns at this time.  Patient Active Problem List   Diagnosis Date Noted   Otitis media 09/04/2021   Gastroesophageal reflux disease without esophagitis 07/07/2021   Annual physical exam 03/10/2021   Hypertriglyceridemia 10/14/2020   Class 3 severe obesity due to excess calories without serious comorbidity with body mass index (BMI) of 40.0 to 44.9 in adult [facility]) 03/14/2020   Essential hypertension 01/08/2018    Social Hx   Social History   Socioeconomic History   Marital status: Married    Spouse name: Not on file   Number of children: Not on file   Years of education: Not on file   Highest education level: Not on file  Occupational History   Not on file  Tobacco Use   Smoking status: Never   Smokeless tobacco: Never  Substance and Sexual Activity   Alcohol use: Not on file   Drug use: Not on file   Sexual activity: Not on file  Other Topics Concern   Not on file  Social History Narrative   Not on file   Social Determinants of Health   Financial Resource Strain: Not on file  Food Insecurity: Not on file  Transportation Needs: Not on file  Physical Activity: Not on file  Stress: Not on file  Social Connections: Not on file    Review of Systems Per HPI  Objective:  BP (!) 160/82   Pulse (!) 103   Temp 98.7 F (37.1 C) (Oral)   Ht 6\' 1"  (1.854 m)   Wt (!) 322 lb 12.8 oz (146.4 kg)   SpO2 95%   BMI 42.59 kg/m      08/31/2021    2:38 PM 08/31/2021    2:11 PM 07/07/2021    4:09 PM  BP/Weight  Systolic BP 160 167  148  Diastolic BP 82 74 94  Wt. (Lbs)  322.8 318  BMI  42.59 kg/m2 41.96 kg/m2    Physical Exam Vitals and nursing note reviewed.  Constitutional:      General: He is not in acute distress.    Appearance: Normal appearance. He is not ill-appearing.  HENT:     Head: Normocephalic and atraumatic.     Ears:     Comments: Left TM erythematous.     Mouth/Throat:     Pharynx: Oropharynx is clear.  Cardiovascular:     Rate and Rhythm: Normal rate and regular rhythm.  Pulmonary:     Effort: Pulmonary effort is normal.     Breath sounds: Normal breath sounds.  Neurological:     Mental Status: He is alert.    Lab Results  Component Value Date   WBC 6.4 03/10/2021   HGB 15.8 03/10/2021   HCT 45.5 03/10/2021   PLT 270 03/10/2021   GLUCOSE 102 (H) 03/10/2021   CHOL 181 03/10/2021   TRIG 125 03/10/2021   HDL 36 (L) 03/10/2021   LDLCALC 122 (H) 03/10/2021   ALT 42 03/10/2021   AST 26 03/10/2021  NA 142 03/10/2021   K 4.4 03/10/2021   CL 104 03/10/2021   CREATININE 1.11 03/10/2021   BUN 12 03/10/2021   CO2 23 03/10/2021   HGBA1C 6.3 (H) 03/10/2021     Assessment & Plan:   Problem List Items Addressed This Visit       Nervous and Auditory   Otitis media    Treating with Augmentin.       Relevant Medications   amoxicillin-clavulanate (AUGMENTIN) 875-125 MG tablet    Meds ordered this encounter  Medications   amoxicillin-clavulanate (AUGMENTIN) 875-125 MG tablet    Sig: Take 1 tablet by mouth 2 (two) times daily.    Dispense:  14 tablet    Refill:  0   Sheccid Lahmann DO [facility]

## 2021-09-04 NOTE — Assessment & Plan Note (Signed)
Treating with Augmentin. 

## 2021-09-08 ENCOUNTER — Ambulatory Visit: Payer: No Typology Code available for payment source | Admitting: Family Medicine

## 2021-09-22 ENCOUNTER — Ambulatory Visit: Payer: Self-pay | Admitting: Family Medicine

## 2021-10-27 ENCOUNTER — Encounter: Payer: Self-pay | Admitting: Family Medicine

## 2021-10-27 ENCOUNTER — Ambulatory Visit (INDEPENDENT_AMBULATORY_CARE_PROVIDER_SITE_OTHER): Payer: PRIVATE HEALTH INSURANCE | Admitting: Family Medicine

## 2021-10-27 VITALS — BP 147/91 | HR 86 | Temp 98.2°F | Wt 327.8 lb

## 2021-10-27 DIAGNOSIS — M545 Low back pain, unspecified: Secondary | ICD-10-CM

## 2021-10-27 MED ORDER — TIZANIDINE HCL 4 MG PO TABS: 4.0000 mg | ORAL_TABLET | Freq: Three times a day (TID) | ORAL | 0 refills | Status: DC | PRN

## 2021-10-27 MED ORDER — MELOXICAM 15 MG PO TABS: 15.0000 mg | ORAL_TABLET | Freq: Every day | ORAL | 0 refills | Status: DC | PRN

## 2021-10-27 NOTE — Patient Instructions (Signed)
Rest, heat.  Medication as prescribed.  If continues to persist, please let me know.  Take care  Dr. Adriana Simas

## 2021-10-27 NOTE — Progress Notes (Signed)
Subjective:  Patient ID: Frank Ruiz, male    DOB: Jun 22, 1990  Age: 31 y.o. MRN: [id number]  CC: Chief Complaint  Patient presents with   Back Pain    Pt pulled muscle in lower part of back yesterday. Pain does not radiate.     HPI:  31 year old male presents with back pain.  Patient reports that he injured his low back yesterday.  Occurred playing tug of war. Bilateral low back pain. No radiation. No radicular symptoms. Worse with sitting and movements. No resolution with ibuprofen or tylenol. No other associated symptoms. No other complaints.   Patient Active Problem List   Diagnosis Date Noted   Acute bilateral low back pain without sciatica 10/27/2021   Gastroesophageal reflux disease without esophagitis 07/07/2021   Annual physical exam 03/10/2021   Hypertriglyceridemia 10/14/2020   Class 3 severe obesity due to excess calories without serious comorbidity with body mass index (BMI) of 40.0 to 44.9 in adult [facility]) 03/14/2020   Essential hypertension 01/08/2018    Social Hx   Social History   Socioeconomic History   Marital status: Married    Spouse name: Not on file   Number of children: Not on file   Years of education: Not on file   Highest education level: Not on file  Occupational History   Not on file  Tobacco Use   Smoking status: Never   Smokeless tobacco: Never  Substance and Sexual Activity   Alcohol use: Not on file   Drug use: Not on file   Sexual activity: Not on file  Other Topics Concern   Not on file  Social History Narrative   Not on file   Social Determinants of Health   Financial Resource Strain: Not on file  Food Insecurity: Not on file  Transportation Needs: Not on file  Physical Activity: Not on file  Stress: Not on file  Social Connections: Not on file    Review of Systems Per HPI  Objective:  BP (!) 147/91   Pulse 86   Temp 98.2 F (36.8 C)   Wt (!) 327 lb 12.8 oz (148.7 kg)   SpO2 93%   BMI 43.25 kg/m      10/27/2021     4:37 PM 10/27/2021    4:08 PM 08/31/2021    2:38 PM  BP/Weight  Systolic BP 147 162 160  Diastolic BP 91 100 82  Wt. (Lbs)  327.8   BMI  43.25 kg/m2     Physical Exam Vitals and nursing note reviewed.  Constitutional:      Appearance: Normal appearance. He is obese.  HENT:     Head: Normocephalic and atraumatic.  Cardiovascular:     Rate and Rhythm: Normal rate and regular rhythm.  Pulmonary:     Effort: Pulmonary effort is normal.     Breath sounds: Normal breath sounds.  Musculoskeletal:     Comments: Lumbar spine - no tenderness to palpation.   Neurological:     Mental Status: He is alert.     Lab Results  Component Value Date   WBC 6.4 03/10/2021   HGB 15.8 03/10/2021   HCT 45.5 03/10/2021   PLT 270 03/10/2021   GLUCOSE 102 (H) 03/10/2021   CHOL 181 03/10/2021   TRIG 125 03/10/2021   HDL 36 (L) 03/10/2021   LDLCALC 122 (H) 03/10/2021   ALT 42 03/10/2021   AST 26 03/10/2021   NA 142 03/10/2021   K 4.4 03/10/2021   CL  104 03/10/2021   CREATININE 1.11 03/10/2021   BUN 12 03/10/2021   CO2 23 03/10/2021   HGBA1C 6.3 (H) 03/10/2021     Assessment & Plan:   Problem List Items Addressed This Visit       Other   Acute bilateral low back pain without sciatica - Primary    Acute pain; lumbar strain. Rest, heat. Mobic and Zanaflex as directed.       Relevant Medications   meloxicam (MOBIC) 15 MG tablet   tiZANidine (ZANAFLEX) 4 MG tablet    Meds ordered this encounter  Medications   meloxicam (MOBIC) 15 MG tablet    Sig: Take 1 tablet (15 mg total) by mouth daily as needed for pain.    Dispense:  30 tablet    Refill:  0   tiZANidine (ZANAFLEX) 4 MG tablet    Sig: Take 1 tablet (4 mg total) by mouth every 8 (eight) hours as needed for muscle spasms.    Dispense:  30 tablet    Refill:  0   Maximos Zayas DO [facility]

## 2021-10-27 NOTE — Assessment & Plan Note (Signed)
Acute pain; lumbar strain. Rest, heat. Mobic and Zanaflex as directed.

## 2022-02-15 ENCOUNTER — Ambulatory Visit (INDEPENDENT_AMBULATORY_CARE_PROVIDER_SITE_OTHER): Payer: PRIVATE HEALTH INSURANCE | Admitting: Family Medicine

## 2022-02-15 ENCOUNTER — Encounter: Payer: Self-pay | Admitting: Family Medicine

## 2022-02-15 VITALS — BP 158/90 | HR 102 | Temp 97.3°F | Ht 73.0 in | Wt 321.0 lb

## 2022-02-15 DIAGNOSIS — I10 Essential (primary) hypertension: Secondary | ICD-10-CM

## 2022-02-15 DIAGNOSIS — E782 Mixed hyperlipidemia: Secondary | ICD-10-CM | POA: Diagnosis not present

## 2022-02-15 DIAGNOSIS — R7303 Prediabetes: Secondary | ICD-10-CM | POA: Insufficient documentation

## 2022-02-15 DIAGNOSIS — Z0000 Encounter for general adult medical examination without abnormal findings: Secondary | ICD-10-CM

## 2022-02-15 DIAGNOSIS — Z130 Encounter for screening for diseases of the blood and blood-forming organs and certain disorders involving the immune mechanism: Secondary | ICD-10-CM

## 2022-02-15 MED ORDER — AMLODIPINE BESYLATE 10 MG PO TABS: 10.0000 mg | ORAL_TABLET | Freq: Every day | ORAL | 3 refills | Status: DC

## 2022-02-15 NOTE — Patient Instructions (Addendum)
Check BP at home. Goal <130/80.  I have increased your BP medication.  Follow up in 6 months for your hypertension.  Take care  Dr. Adriana Simas

## 2022-02-16 NOTE — Assessment & Plan Note (Signed)
Doing well.  Labs today.  Preventative health care has been updated.

## 2022-02-16 NOTE — Progress Notes (Signed)
Subjective:  Patient ID: Frank Ruiz, male    DOB: 1990-06-20  Age: 31 y.o. MRN: [id number]  CC: Chief Complaint  Patient presents with   Annual Exam    HPI:  31 year old male presents for an annual physical exam.  Patient reports that he is doing well.  His blood pressure is significantly elevated here today.  He is compliant with his medication.  We will discuss the need to increase his medication today.  Patient has had his flu vaccine.  Declines COVID-vaccine.  Declines hepatitis C and HIV screening.  Tetanus is up-to-date.  Has no complaints or concerns at this time.  Patient Active Problem List   Diagnosis Date Noted   Mixed hyperlipidemia 02/15/2022   Prediabetes 02/15/2022   Gastroesophageal reflux disease without esophagitis 07/07/2021   Annual physical exam 03/10/2021   Class 3 severe obesity due to excess calories without serious comorbidity with body mass index (BMI) of 40.0 to 44.9 in adult [facility]) 03/14/2020   Essential hypertension 01/08/2018    Social Hx   Social History   Socioeconomic History   Marital status: Married    Spouse name: Not on file   Number of children: Not on file   Years of education: Not on file   Highest education level: Not on file  Occupational History   Not on file  Tobacco Use   Smoking status: Never   Smokeless tobacco: Never  Substance and Sexual Activity   Alcohol use: Not on file   Drug use: Not on file   Sexual activity: Not on file  Other Topics Concern   Not on file  Social History Narrative   Not on file   Social Determinants of Health   Financial Resource Strain: Not on file  Food Insecurity: Not on file  Transportation Needs: Not on file  Physical Activity: Not on file  Stress: Not on file  Social Connections: Not on file    Review of Systems  Constitutional: Negative.   Respiratory: Negative.    Cardiovascular: Negative.    Objective:  BP (!) 158/90   Pulse (!) 102   Temp (!) 97.3 F (36.3 C)   Ht  _0  (1.854 m)   Wt (!) 321 lb (145.6 kg)   SpO2 98%   BMI 42.35 kg/m      02/15/2022    2:04 PM 02/15/2022    1:39 PM 10/27/2021    4:37 PM  BP/Weight  Systolic BP 481 856 314  Diastolic BP 90 970 91  Wt. (Lbs)  321   BMI  42.35 kg/m2     Physical Exam Vitals and nursing note reviewed.  Constitutional:      General: He is not in acute distress.    Appearance: Normal appearance. He is obese.  HENT:     Head: Normocephalic and atraumatic.     Nose: Nose normal.     Mouth/Throat:     Pharynx: Oropharynx is clear.  Eyes:     General:        Right eye: No discharge.        Left eye: No discharge.     Conjunctiva/sclera: Conjunctivae normal.  Cardiovascular:     Rate and Rhythm: Normal rate and regular rhythm.     Heart sounds: No murmur heard. Pulmonary:     Effort: Pulmonary effort is normal.     Breath sounds: Normal breath sounds. No wheezing, rhonchi or rales.  Abdominal:     General: There is no  distension.     Palpations: Abdomen is soft.     Tenderness: There is no abdominal tenderness.  Skin:    General: Skin is warm.     Findings: No rash.  Neurological:     General: No focal deficit present.     Mental Status: He is alert.  Psychiatric:        Mood and Affect: Mood normal.        Behavior: Behavior normal.     Lab Results  Component Value Date   WBC 6.4 03/10/2021   HGB 15.8 03/10/2021   HCT 45.5 03/10/2021   PLT 270 03/10/2021   GLUCOSE 102 (H) 03/10/2021   CHOL 181 03/10/2021   TRIG 125 03/10/2021   HDL 36 (L) 03/10/2021   LDLCALC 122 (H) 03/10/2021   ALT 42 03/10/2021   AST 26 03/10/2021   NA 142 03/10/2021   K 4.4 03/10/2021   CL 104 03/10/2021   CREATININE 1.11 03/10/2021   BUN 12 03/10/2021   CO2 23 03/10/2021   HGBA1C 6.3 (H) 03/10/2021     Assessment & Plan:   Problem List Items Addressed This Visit       Cardiovascular and Mediastinum   Essential hypertension    Uncontrolled.  Increasing amlodipine to 10 mg daily.       Relevant Medications   amLODipine (NORVASC) 10 MG tablet   Other Relevant Orders   CMP14+EGFR   Microalbumin / creatinine urine ratio     Other   Annual physical exam - Primary    Doing well.  Labs today.  Preventative health care has been updated.      Mixed hyperlipidemia   Relevant Medications   amLODipine (NORVASC) 10 MG tablet   Other Relevant Orders   Lipid panel   Prediabetes   Relevant Orders   Hemoglobin A1c   Other Visit Diagnoses     Screening for deficiency anemia       Relevant Orders   CBC       Meds ordered this encounter  Medications   amLODipine (NORVASC) 10 MG tablet    Sig: Take 1 tablet (10 mg total) by mouth daily.    Dispense:  90 tablet    Refill:  3    Follow-up:  No follow-ups on file.  [place]

## 2022-02-16 NOTE — Assessment & Plan Note (Signed)
Uncontrolled.  Increasing amlodipine to 10 mg daily. 

## 2022-02-21 ENCOUNTER — Encounter: Payer: Self-pay | Admitting: Family Medicine

## 2022-02-21 ENCOUNTER — Ambulatory Visit (INDEPENDENT_AMBULATORY_CARE_PROVIDER_SITE_OTHER): Payer: PRIVATE HEALTH INSURANCE | Admitting: Family Medicine

## 2022-02-21 VITALS — BP 138/90 | HR 95 | Temp 98.7°F | Ht 73.0 in | Wt 323.0 lb

## 2022-02-21 DIAGNOSIS — R030 Elevated blood-pressure reading, without diagnosis of hypertension: Secondary | ICD-10-CM

## 2022-02-21 DIAGNOSIS — H65112 Acute and subacute allergic otitis media (mucoid) (sanguinous) (serous), left ear: Secondary | ICD-10-CM

## 2022-02-21 DIAGNOSIS — R0981 Nasal congestion: Secondary | ICD-10-CM | POA: Diagnosis not present

## 2022-02-21 MED ORDER — AMOXICILLIN-POT CLAVULANATE 875-125 MG PO TABS: 1.0000 | ORAL_TABLET | Freq: Two times a day (BID) | ORAL | 0 refills | Status: DC

## 2022-02-21 NOTE — Patient Instructions (Signed)

## 2022-02-21 NOTE — Progress Notes (Signed)
   Subjective:    Patient ID: Frank Ruiz, male    DOB: [birth year], 31 y.o.   MRN: [id number]  HPI Ear pain and muffled since x 2 days  Cough and congestion - yellow , green, no fever Working outdoors with landscaping   Review of Systems     Objective:   Physical Exam  Gen-NAD not toxic TMS-normal on the right side left otitis media noted T- normal no redness Chest-CTA respiratory rate normal no crackles CV RRR no murmur Skin-warm dry Neuro-grossly normal       Assessment & Plan:  1. Head congestion OTC measures  2. Acute mucoid otitis media of left ear Antibiotic prescribed if ongoing troubles or problems or not resolved in the next 10 days follow-up  3. Elevated blood pressure reading Minimizing salt portion control fitting and walking taking blood pressure medicine plus also follow-up with Dr. Adriana Simas within 3 months

## 2022-04-07 LAB — CBC
Hematocrit: 47.4 % (ref 37.5–51.0)
Hemoglobin: 15.8 g/dL (ref 13.0–17.7)
MCH: 27.7 pg (ref 26.6–33.0)
MCHC: 33.3 g/dL (ref 31.5–35.7)
MCV: 83 fL (ref 79–97)
Platelets: 289 10*3/uL (ref 150–450)
RBC: 5.71 x10E6/uL (ref 4.14–5.80)
RDW: 12.2 % (ref 11.6–15.4)
WBC: 6.1 10*3/uL (ref 3.4–10.8)

## 2022-04-07 LAB — CMP14+EGFR
ALT: 34 IU/L (ref 0–44)
AST: 19 IU/L (ref 0–40)
Albumin/Globulin Ratio: 1.6 (ref 1.2–2.2)
Albumin: 4.8 g/dL (ref 4.3–5.2)
Alkaline Phosphatase: 81 IU/L (ref 44–121)
BUN/Creatinine Ratio: 10 (ref 9–20)
BUN: 11 mg/dL (ref 6–20)
Bilirubin Total: 0.4 mg/dL (ref 0.0–1.2)
CO2: 22 mmol/L (ref 20–29)
Calcium: 9.6 mg/dL (ref 8.7–10.2)
Chloride: 100 mmol/L (ref 96–106)
Creatinine, Ser: 1.1 mg/dL (ref 0.76–1.27)
Globulin, Total: 3 g/dL (ref 1.5–4.5)
Glucose: 155 mg/dL — ABNORMAL HIGH (ref 70–99)
Potassium: 4.2 mmol/L (ref 3.5–5.2)
Sodium: 137 mmol/L (ref 134–144)
Total Protein: 7.8 g/dL (ref 6.0–8.5)
eGFR: 93 mL/min/{1.73_m2} (ref >59)

## 2022-04-07 LAB — LIPID PANEL
Chol/HDL Ratio: 4.8 ratio (ref 0.0–5.0)
Cholesterol, Total: 184 mg/dL (ref 100–199)
HDL: 38 mg/dL — ABNORMAL LOW (ref >39)
LDL Chol Calc (NIH): 120 mg/dL — ABNORMAL HIGH (ref 0–99)
Triglycerides: 147 mg/dL (ref 0–149)
VLDL Cholesterol Cal: 26 mg/dL (ref 5–40)

## 2022-04-07 LAB — HEMOGLOBIN A1C
Est. average glucose Bld gHb Est-mCnc: 177 mg/dL
Hgb A1c MFr Bld: 7.8 % — ABNORMAL HIGH (ref 4.8–5.6)

## 2022-04-07 LAB — MICROALBUMIN / CREATININE URINE RATIO
Creatinine, Urine: 312.1 mg/dL
Microalb/Creat Ratio: 27 mg/g creat (ref 0–29)
Microalbumin, Urine: 85.6 ug/mL

## 2022-04-14 ENCOUNTER — Other Ambulatory Visit: Payer: Self-pay | Admitting: Family Medicine

## 2022-04-14 MED ORDER — METFORMIN HCL 500 MG PO TABS: 500.0000 mg | ORAL_TABLET | Freq: Two times a day (BID) | ORAL | 3 refills | Status: DC

## 2022-05-24 ENCOUNTER — Ambulatory Visit: Payer: PRIVATE HEALTH INSURANCE | Admitting: Family Medicine

## 2022-05-24 VITALS — BP 132/84 | Ht 73.0 in | Wt 320.0 lb

## 2022-05-24 DIAGNOSIS — I10 Essential (primary) hypertension: Secondary | ICD-10-CM

## 2022-05-24 DIAGNOSIS — E119 Type 2 diabetes mellitus without complications: Secondary | ICD-10-CM

## 2022-05-24 DIAGNOSIS — E782 Mixed hyperlipidemia: Secondary | ICD-10-CM | POA: Diagnosis not present

## 2022-05-24 NOTE — Assessment & Plan Note (Signed)
Uncontrolled.  Patient working on diet and lifestyle changes.

## 2022-05-24 NOTE — Assessment & Plan Note (Signed)
Uncontrolled.  Patient does not take metformin.  He is working on his diet.  Planning for recheck in 3 months to reassess A1c.

## 2022-05-24 NOTE — Assessment & Plan Note (Signed)
Fairly well-controlled.  BP 132/84 today.  Continue amlodipine.

## 2022-05-24 NOTE — Progress Notes (Signed)
Subjective:  Patient ID: Frank Ruiz, male    DOB: 12/14/1990  Age: 32 y.o. MRN: [id number]  CC: Chief Complaint  Patient presents with   Hypertension    Follow up No problems or concerns    HPI:  31 year old male presents for follow-up.  Hypertension is now further well-controlled on amlodipine.  He is compliant with medication.  He states that he has changed his diet.  Patient has recently diagnosed type 2 diabetes.  A1c rose from 6.3 to 7.8.  Patient states that he is working on his diet and he is not taking metformin.  Patient's hyperlipidemia is not at goal.  He is not on statin therapy at this time.  He is trying to work on this with diet.  Patient Active Problem List   Diagnosis Date Noted   Type 2 diabetes mellitus without complications ([place]) [id number]   Mixed hyperlipidemia 02/15/2022   Gastroesophageal reflux disease without esophagitis 07/07/2021   Annual physical exam 03/10/2021   Class 3 severe obesity due to excess calories without serious comorbidity with body mass index (BMI) of 40.0 to 44.9 in adult [facility]) 03/14/2020   Essential hypertension 01/08/2018    Social Hx   Social History   Socioeconomic History   Marital status: Married    Spouse name: Not on file   Number of children: Not on file   Years of education: Not on file   Highest education level: Not on file  Occupational History   Not on file  Tobacco Use   Smoking status: Never   Smokeless tobacco: Never  Substance and Sexual Activity   Alcohol use: Not on file   Drug use: Not on file   Sexual activity: Not on file  Other Topics Concern   Not on file  Social History Narrative   Not on file   Social Determinants of Health   Financial Resource Strain: Not on file  Food Insecurity: Not on file  Transportation Needs: Not on file  Physical Activity: Not on file  Stress: Not on file  Social Connections: Not on file    Review of Systems  Constitutional: Negative.   Respiratory:  Negative.    Cardiovascular: Negative.     Objective:  BP 132/84   Ht 6' 1"$  (1.854 m)   Wt (!) 320 lb (145.2 kg)   BMI 42.22 kg/m      05/24/2022   10:03 AM 02/21/2022    4:35 PM 02/21/2022    4:00 PM  BP/Weight  Systolic BP [id number]  Diastolic BP 84 90 93  Wt. (Lbs) 320  323  BMI 42.22 kg/m2  42.61 kg/m2    Physical Exam Vitals and nursing note reviewed.  Constitutional:      General: He is not in acute distress.    Appearance: Normal appearance. He is obese.  HENT:     Head: Normocephalic and atraumatic.  Eyes:     General:        Right eye: No discharge.        Left eye: No discharge.     Conjunctiva/sclera: Conjunctivae normal.  Cardiovascular:     Rate and Rhythm: Normal rate and regular rhythm.  Pulmonary:     Effort: Pulmonary effort is normal.     Breath sounds: Normal breath sounds. No wheezing, rhonchi or rales.  Neurological:     Mental Status: He is alert.  Psychiatric:        Mood and Affect: Mood normal.  Behavior: Behavior normal.     Lab Results  Component Value Date   WBC 6.1 04/06/2022   HGB 15.8 04/06/2022   HCT 47.4 04/06/2022   PLT 289 04/06/2022   GLUCOSE 155 (H) 04/06/2022   CHOL 184 04/06/2022   TRIG 147 04/06/2022   HDL 38 (L) 04/06/2022   LDLCALC 120 (H) 04/06/2022   ALT 34 04/06/2022   AST 19 04/06/2022   NA 137 04/06/2022   K 4.2 04/06/2022   CL 100 04/06/2022   CREATININE 1.10 04/06/2022   BUN 11 04/06/2022   CO2 22 04/06/2022   HGBA1C 7.8 (H) 04/06/2022     Assessment & Plan:   Problem List Items Addressed This Visit       Cardiovascular and Mediastinum   Essential hypertension    Fairly well-controlled.  BP 132/84 today.  Continue amlodipine.        Endocrine   Type 2 diabetes mellitus without complications ([name]) - Primary    Uncontrolled.  Patient does not take metformin.  He is working on his diet.  Planning for recheck in 3 months to reassess A1c.        Other   Mixed hyperlipidemia     Uncontrolled.  Patient working on diet and lifestyle changes.      Follow-up:  Return in about 3 months (around 08/22/2022).  [name]

## 2022-05-24 NOTE — Patient Instructions (Signed)
BP looks good.  Watch your diet and stay active.  Follow up in 3 months.

## 2022-08-22 ENCOUNTER — Ambulatory Visit: Payer: PRIVATE HEALTH INSURANCE | Admitting: Family Medicine

## 2022-08-22 VITALS — BP 129/86 | HR 80 | Ht 73.0 in | Wt 305.0 lb

## 2022-08-22 DIAGNOSIS — E119 Type 2 diabetes mellitus without complications: Secondary | ICD-10-CM

## 2022-08-22 DIAGNOSIS — I10 Essential (primary) hypertension: Secondary | ICD-10-CM | POA: Diagnosis not present

## 2022-08-22 NOTE — Patient Instructions (Signed)
Watch diet.  A1C today.  Follow up in 3 months.

## 2022-08-23 LAB — HEMOGLOBIN A1C
Est. average glucose Bld gHb Est-mCnc: 126 mg/dL
Hgb A1c MFr Bld: 6 % — ABNORMAL HIGH (ref 4.8–5.6)

## 2022-08-23 MED ORDER — AMLODIPINE BESYLATE 10 MG PO TABS: 10.0000 mg | ORAL_TABLET | Freq: Every day | ORAL | 3 refills | Status: DC

## 2022-08-23 NOTE — Progress Notes (Signed)
Subjective:  Patient ID: Frank Ruiz, male    DOB: 08-17-1990  Age: 32 y.o. MRN: [id number]  CC: Chief Complaint  Patient presents with   Hypertension   Diabetes    Not on metformin watching diet     HPI:  32 year old male with hypertension, GERD, type 2 diabetes, morbid obesity, and hyperlipidemia presents for follow-up.  Patient's blood pressure is fairly well-controlled on amlodipine.  In regards to his type 2 diabetes, he has made dietary changes.  Needs A1c.  No current pharmacotherapy.  He did not take metformin.  Overall he is feeling well.  He has no complaints at this time.  Patient Active Problem List   Diagnosis Date Noted   Type 2 diabetes mellitus without complications (HCC) 05/24/2022   Mixed hyperlipidemia 02/15/2022   Gastroesophageal reflux disease without esophagitis 07/07/2021   Annual physical exam 03/10/2021   Class 3 severe obesity due to excess calories without serious comorbidity with body mass index (BMI) of 40.0 to 44.9 in adult [facility]) 03/14/2020   Essential hypertension 01/08/2018    Social Hx   Social History   Socioeconomic History   Marital status: Married    Spouse name: Not on file   Number of children: Not on file   Years of education: Not on file   Highest education level: Not on file  Occupational History   Not on file  Tobacco Use   Smoking status: Never   Smokeless tobacco: Never  Substance and Sexual Activity   Alcohol use: Not on file   Drug use: Not on file   Sexual activity: Not on file  Other Topics Concern   Not on file  Social History Narrative   Not on file   Social Determinants of Health   Financial Resource Strain: Not on file  Food Insecurity: Not on file  Transportation Needs: Not on file  Physical Activity: Not on file  Stress: Not on file  Social Connections: Not on file    Review of Systems  Respiratory: Negative.    Cardiovascular: Negative.    Objective:  BP 129/86   Pulse 80   Ht 6\' 1"  (1.854  m)   Wt (!) 305 lb (138.3 kg)   SpO2 97%   BMI 40.24 kg/m      08/22/2022    9:53 AM 05/24/2022   10:03 AM 02/21/2022    4:35 PM  BP/Weight  Systolic BP 129 132 138  Diastolic BP 86 84 90  Wt. (Lbs) 305 320   BMI 40.24 kg/m2 42.22 kg/m2     Physical Exam Vitals and nursing note reviewed.  Constitutional:      General: He is not in acute distress.    Appearance: Normal appearance. He is obese.  HENT:     Head: Normocephalic and atraumatic.  Eyes:     General:        Right eye: No discharge.        Left eye: No discharge.     Conjunctiva/sclera: Conjunctivae normal.  Cardiovascular:     Rate and Rhythm: Normal rate and regular rhythm.  Pulmonary:     Effort: Pulmonary effort is normal.     Breath sounds: Normal breath sounds. No wheezing, rhonchi or rales.  Neurological:     Mental Status: He is alert.  Psychiatric:        Mood and Affect: Mood normal.        Behavior: Behavior normal.     Lab Results  Component Value  Date   WBC 6.1 04/06/2022   HGB 15.8 04/06/2022   HCT 47.4 04/06/2022   PLT 289 04/06/2022   GLUCOSE 155 (H) 04/06/2022   CHOL 184 04/06/2022   TRIG 147 04/06/2022   HDL 38 (L) 04/06/2022   LDLCALC 120 (H) 04/06/2022   ALT 34 04/06/2022   AST 19 04/06/2022   NA 137 04/06/2022   K 4.2 04/06/2022   CL 100 04/06/2022   CREATININE 1.10 04/06/2022   BUN 11 04/06/2022   CO2 22 04/06/2022   HGBA1C 6.0 (H) 08/22/2022     Assessment & Plan:   Problem List Items Addressed This Visit       Cardiovascular and Mediastinum   Essential hypertension    Stable.  Continue amlodipine.      Relevant Medications   amLODipine (NORVASC) 10 MG tablet     Endocrine   Type 2 diabetes mellitus without complications (HCC) - Primary    A1c was obtained and was 6.0.  His glycemic control has improved dramatically with dietary changes.  He will continue to watch his diet.  Needs weight loss.      Relevant Orders   Hemoglobin A1c (Completed)    Meds  ordered this encounter  Medications   amLODipine (NORVASC) 10 MG tablet    Sig: Take 1 tablet (10 mg total) by mouth daily.    Dispense:  90 tablet    Refill:  3    Follow-up:  Return in about 3 months (around 11/22/2022) for Diabetes follow up.  Everlene Other DO [facility]

## 2022-08-23 NOTE — Assessment & Plan Note (Signed)
A1c was obtained and was 6.0.  His glycemic control has improved dramatically with dietary changes.  He will continue to watch his diet.  Needs weight loss.

## 2022-08-23 NOTE — Assessment & Plan Note (Signed)
Stable. °-Continue amlodipine °

## 2022-11-14 NOTE — Progress Notes (Signed)
This encounter was created in error - please disregard.

## 2022-11-26 ENCOUNTER — Ambulatory Visit (INDEPENDENT_AMBULATORY_CARE_PROVIDER_SITE_OTHER): Payer: No Typology Code available for payment source | Admitting: Family Medicine

## 2022-11-26 VITALS — BP 126/80 | HR 75 | Wt 310.0 lb

## 2022-11-26 DIAGNOSIS — H9202 Otalgia, left ear: Secondary | ICD-10-CM

## 2022-11-26 DIAGNOSIS — I10 Essential (primary) hypertension: Secondary | ICD-10-CM

## 2022-11-26 DIAGNOSIS — E119 Type 2 diabetes mellitus without complications: Secondary | ICD-10-CM

## 2022-11-26 DIAGNOSIS — H9209 Otalgia, unspecified ear: Secondary | ICD-10-CM | POA: Insufficient documentation

## 2022-11-26 MED ORDER — AMLODIPINE BESYLATE 10 MG PO TABS: 10.0000 mg | ORAL_TABLET | Freq: Every day | ORAL | 3 refills | Status: DC

## 2022-11-26 NOTE — Assessment & Plan Note (Signed)
Stable. °-Continue amlodipine °

## 2022-11-26 NOTE — Patient Instructions (Signed)
Lab today.  Continue the medication.  Follow up in 6 months.

## 2022-11-26 NOTE — Progress Notes (Signed)
Subjective:  Patient ID: Frank Ruiz, male    DOB: 09-08-1990  Age: 32 y.o. MRN: [id number]  CC:  Follow up.   HPI:  32 year old male with hypertension, type 2 diabetes, obesity, hyperlipidemia presents for follow-up.  Overall, he is feeling well.  He does report recent left ear pain.  It has improved.  He would like me to examine it today.  HTN well controlled on Norvasc.  DM-2 currently diet controlled. Needs A1C today.   Patient Active Problem List   Diagnosis Date Noted   Ear pain 11/26/2022   Type 2 diabetes mellitus without complications (HCC) 05/24/2022   Mixed hyperlipidemia 02/15/2022   Gastroesophageal reflux disease without esophagitis 07/07/2021   Class 3 severe obesity due to excess calories without serious comorbidity with body mass index (BMI) of 40.0 to 44.9 in adult [facility]) 03/14/2020   Essential hypertension 01/08/2018    Social Hx   Social History   Socioeconomic History   Marital status: Married    Spouse name: Not on file   Number of children: Not on file   Years of education: Not on file   Highest education level: Not on file  Occupational History   Not on file  Tobacco Use   Smoking status: Never   Smokeless tobacco: Never  Substance and Sexual Activity   Alcohol use: Not on file   Drug use: Not on file   Sexual activity: Not on file  Other Topics Concern   Not on file  Social History Narrative   Not on file   Social Determinants of Health   Financial Resource Strain: Not on file  Food Insecurity: Not on file  Transportation Needs: Not on file  Physical Activity: Not on file  Stress: Not on file  Social Connections: Not on file    Review of Systems  Constitutional: Negative.   Respiratory: Negative.    Cardiovascular: Negative.    Per HPI  Objective:  BP 126/80   Pulse 75   Wt (!) 310 lb (140.6 kg)   SpO2 97%   BMI 40.90 kg/m      11/26/2022    8:41 AM 08/22/2022    9:53 AM 05/24/2022   10:03 AM  BP/Weight  Systolic BP  126 657 132  Diastolic BP 80 86 84  Wt. (Lbs) 310 305 320  BMI 40.9 kg/m2 40.24 kg/m2 42.22 kg/m2    Physical Exam Constitutional:      Appearance: Normal appearance. He is obese.  HENT:     Ears:     Comments: TMs with scarring bilaterally.  Otherwise normal. Eyes:     General:        Right eye: No discharge.        Left eye: No discharge.     Conjunctiva/sclera: Conjunctivae normal.  Cardiovascular:     Rate and Rhythm: Normal rate and regular rhythm.  Pulmonary:     Effort: Pulmonary effort is normal.     Breath sounds: Normal breath sounds. No wheezing, rhonchi or rales.  Neurological:     Mental Status: He is alert.  Psychiatric:        Mood and Affect: Mood normal.        Behavior: Behavior normal.     Lab Results  Component Value Date   WBC 6.1 04/06/2022   HGB 15.8 04/06/2022   HCT 47.4 04/06/2022   PLT 289 04/06/2022   GLUCOSE 155 (H) 04/06/2022   CHOL 184 04/06/2022   TRIG 147 04/06/2022  HDL 38 (L) 04/06/2022   LDLCALC 120 (H) 04/06/2022   ALT 34 04/06/2022   AST 19 04/06/2022   NA 137 04/06/2022   K 4.2 04/06/2022   CL 100 04/06/2022   CREATININE 1.10 04/06/2022   BUN 11 04/06/2022   CO2 22 04/06/2022   HGBA1C 6.0 (H) 08/22/2022     Assessment & Plan:   Problem List Items Addressed This Visit       Cardiovascular and Mediastinum   Essential hypertension    Stable.  Continue amlodipine.      Relevant Medications   amLODipine (NORVASC) 10 MG tablet     Endocrine   Type 2 diabetes mellitus without complications (HCC) - Primary    A1c today to assess.  Has been diet controlled.  Will continue to monitor closely.      Relevant Orders   Hemoglobin A1c     Other   Ear pain    Improved.  No evidence of infection today.       Meds ordered this encounter  Medications   amLODipine (NORVASC) 10 MG tablet    Sig: Take 1 tablet (10 mg total) by mouth daily.    Dispense:  90 tablet    Refill:  3    Follow-up: 6 months  Diara Chaudhari  Adriana Simas DO [facility]

## 2022-11-26 NOTE — Assessment & Plan Note (Signed)
A1c today to assess.  Has been diet controlled.  Will continue to monitor closely.

## 2022-11-26 NOTE — Assessment & Plan Note (Signed)
Improved.  No evidence of infection today.

## 2023-03-19 ENCOUNTER — Ambulatory Visit (INDEPENDENT_AMBULATORY_CARE_PROVIDER_SITE_OTHER): Payer: 59 | Admitting: Family Medicine

## 2023-03-19 VITALS — BP 132/90 | HR 83 | Temp 98.8°F | Ht 73.0 in | Wt 318.8 lb

## 2023-03-19 DIAGNOSIS — H7393 Unspecified disorder of tympanic membrane, bilateral: Secondary | ICD-10-CM | POA: Diagnosis not present

## 2023-03-19 DIAGNOSIS — E782 Mixed hyperlipidemia: Secondary | ICD-10-CM

## 2023-03-19 DIAGNOSIS — E1169 Type 2 diabetes mellitus with other specified complication: Secondary | ICD-10-CM

## 2023-03-19 DIAGNOSIS — Z0001 Encounter for general adult medical examination with abnormal findings: Secondary | ICD-10-CM

## 2023-03-19 DIAGNOSIS — E119 Type 2 diabetes mellitus without complications: Secondary | ICD-10-CM

## 2023-03-19 DIAGNOSIS — Z0000 Encounter for general adult medical examination without abnormal findings: Secondary | ICD-10-CM | POA: Insufficient documentation

## 2023-03-19 NOTE — Patient Instructions (Signed)
Labs today.  Referral placed.  Eye exam once a year.  Follow up in 6 months.

## 2023-03-19 NOTE — Assessment & Plan Note (Signed)
Labs today.  Advised to get his annual eye exam.  Vaccinations up-to-date. Patient to follow-up in 6 months. Referral to ENT placed given abnormal TMs.

## 2023-03-19 NOTE — Progress Notes (Signed)
Subjective:  Patient ID: Frank Ruiz, male    DOB: 1990-10-20  Age: 32 y.o. MRN: [id number]  CC:  Physical   HPI:  32 year old male with hypertension, obesity, type 2 diabetes, hyperlipidemia presents for an annual physical.  Patient reports that overall he is doing well.  He does report that he is having an odd sensation in his ears.  He has a history of recurrent ear infections as a child.  He denies any pain.  No discharge.  BP on the high side here today.  Patient in need of labs.  Advised to get his eyes checked once a year.  He wears contacts.  Deferring foot exam until next visit as he is wearing boots and headed back to work.  Patient Active Problem List   Diagnosis Date Noted   Annual physical exam 03/19/2023   Type 2 diabetes mellitus without complications (HCC) 05/24/2022   Mixed hyperlipidemia 02/15/2022   Class 3 severe obesity due to excess calories without serious comorbidity with body mass index (BMI) of 40.0 to 44.9 in adult [facility]) 03/14/2020   Essential hypertension 01/08/2018    Social Hx   Social History   Socioeconomic History   Marital status: Married    Spouse name: Not on file   Number of children: Not on file   Years of education: Not on file   Highest education level: Not on file  Occupational History   Not on file  Tobacco Use   Smoking status: Never   Smokeless tobacco: Never  Substance and Sexual Activity   Alcohol use: Not on file   Drug use: Not on file   Sexual activity: Not on file  Other Topics Concern   Not on file  Social History Narrative   Not on file   Social Determinants of Health   Financial Resource Strain: Not on file  Food Insecurity: Not on file  Transportation Needs: Not on file  Physical Activity: Not on file  Stress: Not on file  Social Connections: Not on file    Review of Systems Per HPI  Objective:  BP (!) 132/90   Pulse 83   Temp 98.8 F (37.1 C)   Ht 6\' 1"  (1.854 m)   Wt (!) 318 lb 12.8 oz (144.6  kg)   SpO2 96%   BMI 42.06 kg/m      03/19/2023    9:19 AM 03/19/2023    9:01 AM 11/26/2022    8:41 AM  BP/Weight  Systolic BP 132 144 126  Diastolic BP 90 92 80  Wt. (Lbs)  318.8 310  BMI  42.06 kg/m2 40.9 kg/m2    Physical Exam Vitals and nursing note reviewed.  Constitutional:      General: He is not in acute distress.    Appearance: Normal appearance. He is obese.  HENT:     Head: Normocephalic and atraumatic.     Ears:     Comments: TMs with scarring bilaterally.  Abnormal. Eyes:     General:        Right eye: No discharge.        Left eye: No discharge.     Conjunctiva/sclera: Conjunctivae normal.  Cardiovascular:     Rate and Rhythm: Normal rate and regular rhythm.  Pulmonary:     Effort: Pulmonary effort is normal.     Breath sounds: Normal breath sounds. No wheezing, rhonchi or rales.  Neurological:     Mental Status: He is alert.  Psychiatric:  Mood and Affect: Mood normal.        Behavior: Behavior normal.     Lab Results  Component Value Date   WBC 6.1 04/06/2022   HGB 15.8 04/06/2022   HCT 47.4 04/06/2022   PLT 289 04/06/2022   GLUCOSE 155 (H) 04/06/2022   CHOL 184 04/06/2022   TRIG 147 04/06/2022   HDL 38 (L) 04/06/2022   LDLCALC 120 (H) 04/06/2022   ALT 34 04/06/2022   AST 19 04/06/2022   NA 137 04/06/2022   K 4.2 04/06/2022   CL 100 04/06/2022   CREATININE 1.10 04/06/2022   BUN 11 04/06/2022   CO2 22 04/06/2022   HGBA1C 6.0 (H) 08/22/2022     Assessment & Plan:   Problem List Items Addressed This Visit       Endocrine   Type 2 diabetes mellitus without complications (HCC)   Relevant Orders   CMP14+EGFR   Hemoglobin A1c   Microalbumin / creatinine urine ratio     Other   Mixed hyperlipidemia   Relevant Orders   Lipid panel   Annual physical exam - Primary    Labs today.  Advised to get his annual eye exam.  Vaccinations up-to-date. Patient to follow-up in 6 months. Referral to ENT placed given abnormal TMs.       Other Visit Diagnoses     Abnormal tympanic membrane of both ears       Relevant Orders   Ambulatory referral to ENT       Follow-up: 6 months  Donivan Thammavong Adriana Simas DO [facility]

## 2023-03-21 ENCOUNTER — Other Ambulatory Visit: Payer: Self-pay | Admitting: Family Medicine

## 2023-03-21 ENCOUNTER — Telehealth: Payer: Self-pay | Admitting: *Deleted

## 2023-03-21 LAB — MICROALBUMIN / CREATININE URINE RATIO
Creatinine, Urine: 112.9 mg/dL
Microalb/Creat Ratio: 25 mg/g{creat} (ref 0–29)
Microalbumin, Urine: 27.9 ug/mL

## 2023-03-21 LAB — CMP14+EGFR
ALT: 33 [IU]/L (ref 0–44)
AST: 21 [IU]/L (ref 0–40)
Albumin: 4.7 g/dL (ref 4.3–5.2)
Alkaline Phosphatase: 76 [IU]/L (ref 44–121)
BUN/Creatinine Ratio: 10 (ref 9–20)
BUN: 11 mg/dL (ref 6–20)
Bilirubin Total: 0.5 mg/dL (ref 0.0–1.2)
CO2: 22 mmol/L (ref 20–29)
Calcium: 9.8 mg/dL (ref 8.7–10.2)
Chloride: 101 mmol/L (ref 96–106)
Creatinine, Ser: 1.15 mg/dL (ref 0.76–1.27)
Globulin, Total: 2.9 g/dL (ref 1.5–4.5)
Glucose: 131 mg/dL — ABNORMAL HIGH (ref 70–99)
Potassium: 4.4 mmol/L (ref 3.5–5.2)
Sodium: 141 mmol/L (ref 134–144)
Total Protein: 7.6 g/dL (ref 6.0–8.5)
eGFR: 88 mL/min/{1.73_m2} (ref >59)

## 2023-03-21 LAB — HEMOGLOBIN A1C
Est. average glucose Bld gHb Est-mCnc: 134 mg/dL
Hgb A1c MFr Bld: 6.3 % — ABNORMAL HIGH (ref 4.8–5.6)

## 2023-03-21 LAB — LIPID PANEL
Chol/HDL Ratio: 4.1 {ratio} (ref 0.0–5.0)
Cholesterol, Total: 175 mg/dL (ref 100–199)
HDL: 43 mg/dL (ref >39)
LDL Chol Calc (NIH): 118 mg/dL — ABNORMAL HIGH (ref 0–99)
Triglycerides: 76 mg/dL (ref 0–149)
VLDL Cholesterol Cal: 14 mg/dL (ref 5–40)

## 2023-03-21 MED ORDER — AMOXICILLIN-POT CLAVULANATE 875-125 MG PO TABS: 1.0000 | ORAL_TABLET | Freq: Two times a day (BID) | ORAL | 0 refills | Status: DC

## 2023-03-21 NOTE — Telephone Encounter (Signed)
Copied from CRM [id number]. Topic: Clinical - Medication Question >> Mar 21, 2023  4:14 PM Alcus Dad H wrote: Reason for CRM: Pt was seen by Dr Adriana Simas on the 10th for a physical and let him know about his ear, he said his ear has gotten worse and now he can't hear out of it and was wondering if an antibiotic can be called in to the [employer].

## 2023-03-22 NOTE — Telephone Encounter (Signed)
Coral Spikes, DO     Medication sent.

## 2023-04-17 NOTE — Progress Notes (Signed)
  [address] [place], [place] [address] [phone]  Audiological Evaluation    Name: Frank Ruiz     DOB:   1990-08-08      MRN:   [id number]                                                                                     Service Date: 04/17/2023     Accompanied by: unaccompanied    Patient comes today after Dr. Tobie, ENT sent a referral for a hearing evaluation due to concerns with ear infections   Symptoms Yes Details  Hearing loss  []    Tinnitus  []    Ear pain/ Ear infections  [x]  recurrent  Balance problems  []    Noise exposure  [x]  Reports everyday at work ([employer], [employer], etc)  Previous ear surgeries  [x]  PE tubes as a child  Family history  [x]  Grandfather with age  Amplification  []    Other  []      Otoscopy: Right ear: Abnormal eardrum appearance. Left ear:  Abnormal eardrum appearance.  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  The hearing test results were completed under headphones and results are deemed to be of good reliability. Test technique:  conventional     Speech Audiometry: Right ear- Speech Reception Threshold (SRT) was obtained at 20 dBHL Left ear-Speech Reception Threshold (SRT) was obtained at 40 dBHL   Word Recognition Score Tested using NU-6 (MLV) Right ear: 96% was obtained at a presentation level of 60 dBHL with contralateral masking which is deemed as  excellent Left ear: 95% was obtained at a presentation level of 80 dBHL with contralateral masking which is deemed as  excellent    Recommendations: Repeat audiogram after  medical care.   Sherryn Pollino MARIE LEROUX-MARTINEZ, AUD

## 2023-04-18 ENCOUNTER — Ambulatory Visit (INDEPENDENT_AMBULATORY_CARE_PROVIDER_SITE_OTHER): Payer: 59 | Admitting: Audiology

## 2023-04-18 ENCOUNTER — Encounter (INDEPENDENT_AMBULATORY_CARE_PROVIDER_SITE_OTHER): Payer: Self-pay

## 2023-04-18 ENCOUNTER — Ambulatory Visit (INDEPENDENT_AMBULATORY_CARE_PROVIDER_SITE_OTHER): Payer: 59 | Admitting: Otolaryngology

## 2023-04-18 VITALS — BP 169/104 | HR 86 | Resp 19 | Ht 73.0 in | Wt 318.0 lb

## 2023-04-18 DIAGNOSIS — H6993 Unspecified Eustachian tube disorder, bilateral: Secondary | ICD-10-CM | POA: Diagnosis not present

## 2023-04-18 DIAGNOSIS — H6693 Otitis media, unspecified, bilateral: Secondary | ICD-10-CM

## 2023-04-18 DIAGNOSIS — J3089 Other allergic rhinitis: Secondary | ICD-10-CM | POA: Diagnosis not present

## 2023-04-18 DIAGNOSIS — H90A32 Mixed conductive and sensorineural hearing loss, unilateral, left ear with restricted hearing on the contralateral side: Secondary | ICD-10-CM

## 2023-04-18 DIAGNOSIS — H90A11 Conductive hearing loss, unilateral, right ear with restricted hearing on the contralateral side: Secondary | ICD-10-CM | POA: Diagnosis not present

## 2023-04-18 DIAGNOSIS — H900 Conductive hearing loss, bilateral: Secondary | ICD-10-CM | POA: Diagnosis not present

## 2023-04-18 MED ORDER — FLUTICASONE PROPIONATE 50 MCG/ACT NA SUSP: 2.0000 | Freq: Two times a day (BID) | NASAL | 6 refills | Status: AC

## 2023-04-18 MED ORDER — AZELASTINE HCL 0.1 % NA SOLN: 2.0000 | Freq: Two times a day (BID) | NASAL | 12 refills | Status: AC

## 2023-04-18 NOTE — Progress Notes (Signed)
 Dear Dr. Bluford, Here is my assessment for our mutual patient, Frank Ruiz. Thank you for allowing me the opportunity to care for your patient. Please do not hesitate to contact me should you have any other questions. Sincerely, Dr. Eldora Blanch  Otolaryngology Clinic Note Referring provider: Dr. Bluford HPI:  Frank Ruiz is a 33 y.o. male kindly referred by Dr. Bluford for evaluation of ear discomfort.  Initial visit (04/2023): Patient reports: that he has been issues with his ears for several years. He reports that he has always had ear problems (BTT as kids), and reports that he gets 2-3 ear infections/year. He reports that his ears hurt (both) during that time, and reports that he has bilateral ear pressure (left is worse). Last episode started about 2-3 weeks ago, ear pressure now ongoing for about 2-3 weeks and feels funny. Ear pain resolved after taking abx. Does not know if there is pressure behind the ears. Intermittent tinnitus, especially in quiet. He gets antibiotics and they do help.  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 Does also shoot guns, tries to wear ear plugs.   Does have some seasonal allergies - pollen season and mostly when the weather changes. Congestion, runny nose. Sometimes itchy eyes and nose. Does not use anything for it.   PMHx: Pre-diabetic, HLD, HTN, GERD  H&N Surgery: BTT Personal or FHx of bleeding dz or anesthesia difficulty: no  AP/AC: no  Tobacco: no. Occupation: see above. Lives in [place], [place]  Independent Review of Additional Tests or Records:  Dr. Bluford (08/31/2021): sinus pressure, drainage, no other sx;  Dx: AOM, Rx: augmentin  Dr. Alphonsa FM (02/21/2022): ear pain and muffled x2 days, cough/congestion, Dx: Acute mucoid otitis media left ear; Rx: augmentin  Dr. Bluford 03/19/2023 FM: odd sensation in ears, noted b/l scarring of TM, Ref to ENT CBC 03/2022: wnl, no leukocytosis No prior ENT notes I can see; no head imaging 04/2023 Audiogram was independently reviewed and interpreted by me and it reveals - bilateral CHL, with left worse than right; Tymps: Left type B, right type C; WRT 96% at 60dB AD, 92% at 80dB AS   SNHL= Sensorineural hearing loss  PMH/Meds/All/SocHx/FamHx/ROS:   Past Medical History:  Diagnosis Date   Hypertension      History reviewed. No pertinent surgical history.  Family History  Problem Relation Age of Onset   Healthy Mother    Healthy Father      Social Connections: Not on file      Current Outpatient Medications:    amLODipine  (NORVASC ) 10 MG tablet, Take 1 tablet (10 mg total) by mouth daily., Disp: 90 tablet, Rfl: 3   amoxicillin -clavulanate (AUGMENTIN ) 875-125 MG tablet, Take 1 tablet by mouth 2 (two) times daily. (Patient not taking: Reported on 04/18/2023), Disp: 20 tablet, Rfl: 0   azelastine  (ASTELIN ) 0.1 % nasal spray, Place 2 sprays into both nostrils 2 (two) times daily. Use in each nostril as directed, Disp: 30 mL, Rfl: 12   fluticasone  (FLONASE ) 50 MCG/ACT nasal spray, Place 2 sprays into both nostrils 2 (two) times daily., Disp: 16 g, Rfl: 6   Physical Exam:   BP (!) 169/104 (BP Location: Right Arm, Patient Position: Sitting, Cuff Size: Normal) Comment: a  lot of activity from work and takes bp medication  Pulse 86   Resp 19   Ht 6' 1 (1.854 m)   Wt (!) 318 lb (144.2 kg)   SpO2 96%   BMI 41.96 kg/m   Salient findings:  CN II-XII intact Given history and complaints, ear microscopy was indicated and performed for evaluation with findings as below in physical exam section and in procedures Bilateral EAC clear; bilateral diffuse  myringosclerosis, with clear retraction over pars flaccida especially and perhaps mild type 3 myringoincudostapediopexy on right; left also retracted over pars flaccida; no evidence of cholesteatoma or perforation or keratin debris today Ears quite sensitive, not particularly tolerant to any cerumen removal; no effusion noted either side  Weber 512: mid Rinne 512: AC > BC b/l  Anterior rhinoscopy: Septum intact; bilateral inferior turbinates without significant hypertrophy but some mucoid secretions noted and some crusting b/l No lesions of oral cavity/oropharynx No obviously palpable neck masses/lymphadenopathy/thyromegaly No respiratory distress or stridor  Seprately Identifiable Procedures:  Procedure: Bilateral ear microscopy using microscope (CPT 92504) Pre-procedure diagnosis: bilateral otitis media, bilateral eustachian tube dysfunction Post-procedure diagnosis: same Indication: see above; given patient's otologic complaints and history, for improved and comprehensive examination of external ear and tympanic membrane, bilateral otologic examination using microscope was performed  Procedure: Patient was placed semi-recumbent. Both ear canals were examined using the microscope with findings above Patient tolerated the procedure well.   Impression & Plans:  Frank Ruiz is a 33 y.o. male with:  1. Dysfunction of both eustachian tubes   2. Hearing loss, conductive, bilateral   3. Non-seasonal allergic rhinitis, unspecified trigger   4. Chronic otitis media of both ears    Chronic ear trouble with multiple ear infections and BTT as a child; now with regular exacerbations; Both ears retracted and he does have sensation of fullness and ETD symptoms; ears need to be watched given negative pressure, though no evidence of cholesteatoma; he also has sinonasal/AR symptoms and we discussed how these can contribute to his ear findings as well, especially with B/C tymps Will try medical management  first; could consider BTT as well but would likely need OR or auricular block since patient is not particularly tolerant of exam  - Start flonase  BID - Start astelin  BID - Autoinsufflate ears multiple times per day - f/u in 2 months  See below regarding exact medications prescribed this encounter including dosages and route: Meds ordered this encounter  Medications   fluticasone  (FLONASE ) 50 MCG/ACT nasal spray    Sig: Place 2 sprays into both nostrils 2 (two) times daily.    Dispense:  16 g    Refill:  6   azelastine  (ASTELIN ) 0.1 % nasal spray    Sig: Place 2 sprays into both nostrils 2 (two) times daily. Use in each nostril as directed    Dispense:  30 mL    Refill:  12      Thank you for allowing me the opportunity to care for your patient. Please do not hesitate to contact me should you have any other questions.  Sincerely, Eldora Blanch, MD Otolarynoglogist (ENT), [facility] Phone: [phone] Fax: [phone]  04/28/2023, 6:58 PM   MDM:  Level 4 - 99204 Complexity/Problems addressed: mod - multiple chronic problems Data complexity: mod - independent review of notes, labs; ordering audiogram - Morbidity: mod  - Prescription Drug prescribed or managed: yes

## 2023-04-18 NOTE — Patient Instructions (Signed)
 Use flonase two sprays each nostril twice per day Use astelin two sprays each nostril twice per day

## 2023-05-29 ENCOUNTER — Ambulatory Visit: Payer: PRIVATE HEALTH INSURANCE | Admitting: Family Medicine

## 2023-06-21 ENCOUNTER — Ambulatory Visit (INDEPENDENT_AMBULATORY_CARE_PROVIDER_SITE_OTHER): Payer: 59

## 2023-09-26 ENCOUNTER — Ambulatory Visit: Payer: 59 | Admitting: Family Medicine

## 2023-10-21 ENCOUNTER — Ambulatory Visit: Admitting: Family Medicine

## 2023-10-21 VITALS — BP 127/80 | HR 89 | Temp 97.9°F | Wt 320.0 lb

## 2023-10-21 DIAGNOSIS — I10 Essential (primary) hypertension: Secondary | ICD-10-CM | POA: Diagnosis not present

## 2023-10-21 DIAGNOSIS — E119 Type 2 diabetes mellitus without complications: Secondary | ICD-10-CM | POA: Diagnosis not present

## 2023-10-21 DIAGNOSIS — E782 Mixed hyperlipidemia: Secondary | ICD-10-CM

## 2023-10-21 DIAGNOSIS — Z130 Encounter for screening for diseases of the blood and blood-forming organs and certain disorders involving the immune mechanism: Secondary | ICD-10-CM | POA: Diagnosis not present

## 2023-10-21 MED ORDER — AMLODIPINE BESYLATE 10 MG PO TABS: 10.0000 mg | ORAL_TABLET | Freq: Every day | ORAL | 3 refills | Status: DC

## 2023-10-21 NOTE — Patient Instructions (Signed)
 Labs today.  Follow up in 6 months.  Call or message with concerns.  Take care  Dr. Bluford

## 2023-10-21 NOTE — Assessment & Plan Note (Signed)
 BP improved on repeat.  Continue amlodipine .  Medication refilled today.

## 2023-10-21 NOTE — Assessment & Plan Note (Signed)
Lipid panel to reassess.

## 2023-10-21 NOTE — Progress Notes (Signed)
 Subjective:  Patient ID: Frank Ruiz, male    DOB: [birth year]  Age: 33 y.o. MRN: [id number]  CC:   Chief Complaint  Patient presents with   Hypertension   Diabetes    HPI:  33 year old male presents for follow-up.  Patient's blood pressure initially elevated here today.  Will repeat.  He is compliant with amlodipine .  Needs refill.  Patient's last A1c 6.3.  Patient's A1c has been as high as 7.8.  He is currently diet controlled.  Needs labs including A1c today.  Needs foot exam.  Patient states that he is feeling well.  No chest pain or shortness of breath.  He has no complaints at this time.  Patient Active Problem List   Diagnosis Date Noted   Annual physical exam 03/19/2023   Type 2 diabetes mellitus without complications (HCC) 05/24/2022   Mixed hyperlipidemia 02/15/2022   Class 3 severe obesity due to excess calories without serious comorbidity with body mass index (BMI) of 40.0 to 44.9 in adult 03/14/2020   Essential hypertension 01/08/2018    Social Hx   Social History   Socioeconomic History   Marital status: Married    Spouse name: Not on file   Number of children: Not on file   Years of education: Not on file   Highest education level: Not on file  Occupational History   Not on file  Tobacco Use   Smoking status: Never   Smokeless tobacco: Never  Substance and Sexual Activity   Alcohol use: Not on file   Drug use: Not on file   Sexual activity: Not on file  Other Topics Concern   Not on file  Social History Narrative   Not on file   Social Drivers of Health   Financial Resource Strain: Not on file  Food Insecurity: Not on file  Transportation Needs: Not on file  Physical Activity: Not on file  Stress: Not on file  Social Connections: Not on file    Review of Systems Per HPI  Objective:  BP 127/80   Pulse 89   Temp 97.9 F (36.6 C)   Wt (!) 320 lb (145.2 kg)   SpO2 96%   BMI 42.22 kg/m      10/21/2023    8:48 AM 10/21/2023    8:40  AM 04/18/2023    3:32 PM  BP/Weight  Systolic BP 127 144 169  Diastolic BP 80 102 104  Wt. (Lbs)  320 318  BMI  42.22 kg/m2 41.96 kg/m2    Physical Exam Vitals and nursing note reviewed.  Constitutional:      General: He is not in acute distress.    Appearance: Normal appearance.  HENT:     Head: Normocephalic and atraumatic.  Eyes:     General:        Right eye: No discharge.        Left eye: No discharge.     Conjunctiva/sclera: Conjunctivae normal.  Cardiovascular:     Rate and Rhythm: Normal rate and regular rhythm.     Heart sounds: No murmur heard. Pulmonary:     Effort: Pulmonary effort is normal.     Breath sounds: Normal breath sounds. No wheezing, rhonchi or rales.  Feet:     Comments: Foot exam performed.  See quality metrics section. Neurological:     Mental Status: He is alert.  Psychiatric:        Mood and Affect: Mood normal.        Behavior:  Behavior normal.     Lab Results  Component Value Date   WBC 6.1 04/06/2022   HGB 15.8 04/06/2022   HCT 47.4 04/06/2022   PLT 289 04/06/2022   GLUCOSE 131 (H) 03/19/2023   CHOL 175 03/19/2023   TRIG 76 03/19/2023   HDL 43 03/19/2023   LDLCALC 118 (H) 03/19/2023   ALT 33 03/19/2023   AST 21 03/19/2023   NA 141 03/19/2023   K 4.4 03/19/2023   CL 101 03/19/2023   CREATININE 1.15 03/19/2023   BUN 11 03/19/2023   CO2 22 03/19/2023   HGBA1C 6.3 (H) 03/19/2023     Assessment & Plan:  Type 2 diabetes mellitus without complication, without long-term current use of insulin (HCC) Assessment & Plan: Has been stable.  Rechecking A1c.  Orders: -     CMP14+EGFR -     Hemoglobin A1c -     Microalbumin / creatinine urine ratio  Mixed hyperlipidemia Assessment & Plan: Lipid panel to reassess.  Orders: -     Lipid panel  Screening for deficiency anemia -     CBC  Essential hypertension Assessment & Plan: BP improved on repeat.  Continue amlodipine .  Medication refilled today.  Orders: -      amLODIPine  Besylate; Take 1 tablet (10 mg total) by mouth daily.  Dispense: 90 tablet; Refill: 3    Follow-up:  Return in about 6 months (around 04/22/2024).  Jacqulyn Ahle DO [facility]

## 2023-10-21 NOTE — Assessment & Plan Note (Signed)
 Has been stable.  Rechecking A1c.

## 2023-11-05 ENCOUNTER — Telehealth: Payer: Self-pay | Admitting: *Deleted

## 2023-11-05 ENCOUNTER — Other Ambulatory Visit: Payer: Self-pay | Admitting: Family Medicine

## 2023-11-05 DIAGNOSIS — I10 Essential (primary) hypertension: Secondary | ICD-10-CM

## 2023-11-05 MED ORDER — AMLODIPINE BESYLATE 10 MG PO TABS
10.0000 mg | ORAL_TABLET | Freq: Every day | ORAL | 0 refills | Status: AC
Start: 2023-11-05 — End: ?

## 2023-11-05 NOTE — Telephone Encounter (Unsigned)
 Copied from CRM [id number]. Topic: Clinical - Medication Question >> Nov 05, 2023 10:14 AM Franky GRADE wrote: Reason for CRM: Patient forgot his blood pressure medication and is currently on vacation, he would like to know if there is any way to send a prescription to hold him over until Saturday amLODipine  (NORVASC ) 10 MG tablet [[id number]]. [facility] [address] [place], [place] - [address]  Phone: [phone] Fax: [phone]

## 2023-11-06 NOTE — Telephone Encounter (Signed)
 Cook, [name], DO      11/05/23  3:59 PM I sent it in.

## 2023-11-28 ENCOUNTER — Ambulatory Visit: Payer: Self-pay

## 2023-11-28 NOTE — Telephone Encounter (Signed)
  FYI Only or Action Required?: FYI only for provider.  Patient was last seen in primary care on 10/21/2023 by Cook, [name], DO.  Called Nurse Triage reporting Neck Pain.  Symptoms began several weeks ago.  Interventions attempted: OTC medications: advil.  Symptoms are: gradually worsening.  Triage Disposition: See PCP When Office is Open (Within 3 Days)  Patient/caregiver understands and will follow disposition?: Yes  Copied from CRM #[id number]. Topic: Clinical - Red Word Triage >> Nov 28, 2023  4:21 PM Tysheama G wrote: Red Word that prompted transfer to Nurse Triage: Patient called stating he's been having neck and shoulder pains for 2weeks now and his hands feel numb/tingling Reason for Disposition  [1] MODERATE neck pain (e.g., interferes with normal activities) AND [2] present > 3 days  Answer Assessment - Initial Assessment Questions 1. ONSET: When did the pain begin?      About two weeks ago 2. LOCATION: Where does it hurt?      Back of neck and left shoulder pain 3. PATTERN Does the pain come and go, or has it been constant since it started?      constant 4. SEVERITY: How bad is the pain?  (Scale 0-10; or none or slight stiffness, mild, moderate, severe)     6/10 5. RADIATION: Does the pain go anywhere else, shoot into your arms?     denies 6. CORD SYMPTOMS: Any weakness or numbness of the arms or legs?     Numbness to left hand intermittently began one week ago 7. CAUSE: What do you think is causing the neck pain?     occupational 8. NECK OVERUSE: Any recent activities that involved turning or twisting the neck?     Mows and weed eats for work, hit a hole a couple of weeks ago resulting in worsening pain 9. OTHER SYMPTOMS: Do you have any other symptoms? (e.g., headache, fever, chest pain, difficulty breathing, neck swelling)     N/A 10. PREGNANCY: Is there any chance you are pregnant? When was your last menstrual period?       N/A  Protocols  used: Neck Pain or Stiffness-A-AH

## 2023-11-29 ENCOUNTER — Encounter: Payer: Self-pay | Admitting: Family Medicine

## 2023-11-29 ENCOUNTER — Ambulatory Visit: Admitting: Family Medicine

## 2023-11-29 VITALS — BP 129/86 | HR 87 | Temp 98.2°F | Ht 73.0 in | Wt 320.0 lb

## 2023-11-29 DIAGNOSIS — M7918 Myalgia, other site: Secondary | ICD-10-CM

## 2023-11-29 MED ORDER — TIZANIDINE HCL 4 MG PO TABS: 4.0000 mg | ORAL_TABLET | Freq: Three times a day (TID) | ORAL | 0 refills | Status: DC | PRN

## 2023-11-29 MED ORDER — DICLOFENAC SODIUM 75 MG PO TBEC: 75.0000 mg | DELAYED_RELEASE_TABLET | Freq: Two times a day (BID) | ORAL | 0 refills | Status: DC | PRN

## 2023-11-29 NOTE — Patient Instructions (Signed)
 Rest. Heat.  Medications as prescribed.

## 2023-12-01 DIAGNOSIS — M7918 Myalgia, other site: Secondary | ICD-10-CM | POA: Insufficient documentation

## 2023-12-01 NOTE — Assessment & Plan Note (Signed)
 Muscular in nature. Treating with Diclofenac  and Zanaflex .

## 2023-12-01 NOTE — Progress Notes (Signed)
 Subjective:  Patient ID: Frank Ruiz, male    DOB: [birth year]  Age: 33 y.o. MRN: [id number]  CC:   Chief Complaint  Patient presents with   Neck Pain    Shoulder pain x 2 weeks , no injury reported , pains throughout the day worse at night  Tried tylenol/ ibuprofen    HPI:  33 year old male presents for evaluation of the above.  2 week history of neck pain (goes toward the shoulder).  Works a physically demanding job. No recent fall, trauma, or injury. No relief with OTC Tylenol or Ibuprofen. No radicular symptoms.  Patient Active Problem List   Diagnosis Date Noted   Musculoskeletal pain 12/01/2023   Annual physical exam 03/19/2023   Type 2 diabetes mellitus without complications (HCC) 05/24/2022   Mixed hyperlipidemia 02/15/2022   Class 3 severe obesity due to excess calories without serious comorbidity with body mass index (BMI) of 40.0 to 44.9 in adult 03/14/2020   Essential hypertension 01/08/2018    Social Hx   Social History   Socioeconomic History   Marital status: Married    Spouse name: Not on file   Number of children: Not on file   Years of education: Not on file   Highest education level: Not on file  Occupational History   Not on file  Tobacco Use   Smoking status: Never   Smokeless tobacco: Never  Substance and Sexual Activity   Alcohol use: Not on file   Drug use: Not on file   Sexual activity: Not on file  Other Topics Concern   Not on file  Social History Narrative   Not on file   Social Drivers of Health   Financial Resource Strain: Not on file  Food Insecurity: Not on file  Transportation Needs: Not on file  Physical Activity: Not on file  Stress: Not on file  Social Connections: Not on file    Review of Systems Per HPI  Objective:  BP 129/86   Pulse 87   Temp 98.2 F (36.8 C)   Ht 6' 1 (1.854 m)   Wt (!) 320 lb (145.2 kg)   SpO2 95%   BMI 42.22 kg/m      11/29/2023   10:51 AM 11/29/2023   10:08 AM 10/21/2023    8:48 AM   BP/Weight  Systolic BP 129 142 127  Diastolic BP 86 92 80  Wt. (Lbs)  320   BMI  42.22 kg/m2     Physical Exam Vitals and nursing note reviewed.  Constitutional:      General: He is not in acute distress.    Appearance: Normal appearance.  HENT:     Head: Normocephalic and atraumatic.  Neck:      Comments: Tenderness to palpation at the labeled location.  Cardiovascular:     Rate and Rhythm: Normal rate and regular rhythm.  Pulmonary:     Effort: Pulmonary effort is normal.     Breath sounds: No wheezing or rales.  Neurological:     Mental Status: He is alert.  Psychiatric:        Mood and Affect: Mood normal.        Behavior: Behavior normal.     Lab Results  Component Value Date   WBC 6.1 04/06/2022   HGB 15.8 04/06/2022   HCT 47.4 04/06/2022   PLT 289 04/06/2022   GLUCOSE 131 (H) 03/19/2023   CHOL 175 03/19/2023   TRIG 76 03/19/2023   HDL 43  03/19/2023   LDLCALC 118 (H) 03/19/2023   ALT 33 03/19/2023   AST 21 03/19/2023   NA 141 03/19/2023   K 4.4 03/19/2023   CL 101 03/19/2023   CREATININE 1.15 03/19/2023   BUN 11 03/19/2023   CO2 22 03/19/2023   HGBA1C 6.3 (H) 03/19/2023     Assessment & Plan:  Musculoskeletal pain Assessment & Plan: Muscular in nature. Treating with Diclofenac  and Zanaflex .  Orders: -     Diclofenac  Sodium; Take 1 tablet (75 mg total) by mouth 2 (two) times daily as needed for moderate pain (pain score 4-6).  Dispense: 30 tablet; Refill: 0 -     tiZANidine  HCl; Take 1 tablet (4 mg total) by mouth every 8 (eight) hours as needed for muscle spasms.  Dispense: 30 tablet; Refill: 0    Follow-up:  Return if symptoms worsen or fail to improve.  Jacqulyn Ahle DO [facility]

## 2023-12-04 ENCOUNTER — Other Ambulatory Visit: Payer: Self-pay | Admitting: Family Medicine

## 2023-12-04 ENCOUNTER — Ambulatory Visit: Payer: Self-pay

## 2023-12-04 MED ORDER — TRAMADOL HCL 50 MG PO TABS: 50.0000 mg | ORAL_TABLET | Freq: Three times a day (TID) | ORAL | 0 refills | Status: DC | PRN

## 2023-12-04 MED ORDER — PREDNISONE 10 MG PO TABS
ORAL_TABLET | ORAL | 0 refills | Status: AC
Start: 2023-12-04 — End: ?

## 2023-12-04 NOTE — Telephone Encounter (Signed)
 FYI Only or Action Required?: FYI only for provider.  Patient was last seen in primary care on 11/29/2023 by Ruiz, Frank G, DO.  Called Nurse Triage reporting Neck Pain.  Symptoms began several weeks ago.  Interventions attempted: OTC medications: Tylenol, Ibuprofen and Prescription medications: Muscle Relaxer.  Symptoms are: unchanged.  Triage Disposition: See PCP When Office is Open (Within 3 Days)  Patient/caregiver understands and will follow disposition?: Yes     Copied from CRM [id number]. Topic: Clinical - Red Word Triage >> Dec 04, 2023 10:13 AM Frank Ruiz wrote: Red Word that prompted transfer to Nurse Triage: Patient was seen on 08/22 for pain in his neck and was prescribed a muscle relaxers. States he is still having severe pain in his neck and the medication is not helping. Reason for Disposition  [1] MODERATE neck pain (e.g., interferes with normal activities) AND [2] present > 3 days  Answer Assessment - Initial Assessment Questions Denies any recent injury. Ibuprofen with little relief. States its a little bit of everything (burning, sharp, dull). Able to make a fist and use L arm with no issues.    1. ONSET: When did the pain begin?      Beginning of August  2. LOCATION: Where does it hurt?      Back of neck to left shoulder, Neck stiff, painful to lift left arm  3. PATTERN Does the pain come and go, or has it been constant since it started?      Constant  4. SEVERITY: How bad is the pain?  (Scale 0-10; or none or slight stiffness, mild, moderate, severe)     10/10  5. RADIATION: Does the pain go anywhere else, shoot into your arms?     L arm  6. CORD SYMPTOMS: Any weakness or numbness of the arms or legs?     Left arm, feels like pins and needles.  7. CAUSE: What do you think is causing the neck pain?     Unsure  8. NECK OVERUSE: Any recent activities that involved turning or twisting the neck?     No  9. OTHER SYMPTOMS: Do you  have any other symptoms? (e.g., headache, fever, chest pain, difficulty breathing, neck swelling)     No  Protocols used: Neck Pain or Stiffness-A-AH

## 2023-12-06 ENCOUNTER — Ambulatory Visit: Admitting: Family Medicine

## 2023-12-06 VITALS — BP 133/89 | HR 78 | Ht 73.0 in | Wt 214.0 lb

## 2023-12-06 DIAGNOSIS — M7918 Myalgia, other site: Secondary | ICD-10-CM | POA: Diagnosis not present

## 2023-12-09 NOTE — Assessment & Plan Note (Signed)
 I have already sent in Tramadol  and Prednisone . Advised to pick up meds.  Supportive care.

## 2023-12-09 NOTE — Progress Notes (Signed)
 Subjective:  Patient ID: Frank Ruiz, male    DOB: 1991-01-15  Age: 33 y.o. MRN: [id number]  CC:   Chief Complaint  Patient presents with   neck and shoulder pain     2 weeks constant, no injuries, nothing helps - had not picked up recent rx    HPI:  33 year old male presents for evaluation of the above.  Recently seen on 8/22. Treated with Diclofenac  and Zanaflex . Reports continued pain - left trapezius predominantly. He messaged via [facility] on 8/27. I sent in additional medication. He reports that he has not yet picked these meds up but will do so today. No radicular symptoms.  Patient Active Problem List   Diagnosis Date Noted   Musculoskeletal pain 12/01/2023   Annual physical exam 03/19/2023   Type 2 diabetes mellitus without complications (HCC) 05/24/2022   Mixed hyperlipidemia 02/15/2022   Class 3 severe obesity due to excess calories without serious comorbidity with body mass index (BMI) of 40.0 to 44.9 in adult 03/14/2020   Essential hypertension 01/08/2018    Social Hx   Social History   Socioeconomic History   Marital status: Married    Spouse name: Not on file   Number of children: Not on file   Years of education: Not on file   Highest education level: Not on file  Occupational History   Not on file  Tobacco Use   Smoking status: Never   Smokeless tobacco: Never  Substance and Sexual Activity   Alcohol use: Not on file   Drug use: Not on file   Sexual activity: Not on file  Other Topics Concern   Not on file  Social History Narrative   Not on file   Social Drivers of Health   Financial Resource Strain: Not on file  Food Insecurity: Not on file  Transportation Needs: Not on file  Physical Activity: Not on file  Stress: Not on file  Social Connections: Not on file    Review of Systems Per HPI  Objective:  BP 133/89   Pulse 78   Ht 6' 1 (1.854 m)   Wt 214 lb (97.1 kg)   SpO2 96%   BMI 28.23 kg/m      12/06/2023   11:25 AM 11/29/2023    10:51 AM 11/29/2023   10:08 AM  BP/Weight  Systolic BP 133 129 142  Diastolic BP 89 86 92  Wt. (Lbs) 214  320  BMI 28.23 kg/m2  42.22 kg/m2    Physical Exam Vitals and nursing note reviewed.  Constitutional:      General: He is not in acute distress.    Appearance: Normal appearance.  HENT:     Head: Normocephalic and atraumatic.  Pulmonary:     Effort: Pulmonary effort is normal.  Musculoskeletal:     Comments: Continues to have tenderness over the left trapezius muscle superiorly.   Neurological:     Mental Status: He is alert.     Lab Results  Component Value Date   WBC 6.1 04/06/2022   HGB 15.8 04/06/2022   HCT 47.4 04/06/2022   PLT 289 04/06/2022   GLUCOSE 131 (H) 03/19/2023   CHOL 175 03/19/2023   TRIG 76 03/19/2023   HDL 43 03/19/2023   LDLCALC 118 (H) 03/19/2023   ALT 33 03/19/2023   AST 21 03/19/2023   NA 141 03/19/2023   K 4.4 03/19/2023   CL 101 03/19/2023   CREATININE 1.15 03/19/2023   BUN 11 03/19/2023  CO2 22 03/19/2023   HGBA1C 6.3 (H) 03/19/2023     Assessment & Plan:  Musculoskeletal pain Assessment & Plan: I have already sent in Tramadol  and Prednisone . Advised to pick up meds.  Supportive care.     Follow-up:  Return if symptoms worsen or fail to improve.  Jacqulyn Ahle DO [facility]

## 2023-12-23 DIAGNOSIS — M546 Pain in thoracic spine: Secondary | ICD-10-CM | POA: Diagnosis not present

## 2023-12-23 DIAGNOSIS — M5412 Radiculopathy, cervical region: Secondary | ICD-10-CM | POA: Diagnosis not present

## 2023-12-23 DIAGNOSIS — M9902 Segmental and somatic dysfunction of thoracic region: Secondary | ICD-10-CM | POA: Diagnosis not present

## 2023-12-23 DIAGNOSIS — M9901 Segmental and somatic dysfunction of cervical region: Secondary | ICD-10-CM | POA: Diagnosis not present

## 2024-01-21 DIAGNOSIS — Z23 Encounter for immunization: Secondary | ICD-10-CM | POA: Diagnosis not present

## 2024-02-13 ENCOUNTER — Other Ambulatory Visit: Payer: Self-pay | Admitting: Family Medicine

## 2024-02-13 DIAGNOSIS — M7918 Myalgia, other site: Secondary | ICD-10-CM

## 2024-03-19 ENCOUNTER — Ambulatory Visit: Payer: Self-pay | Admitting: Family Medicine

## 2024-03-19 ENCOUNTER — Ambulatory Visit: Admitting: Family Medicine

## 2024-03-19 ENCOUNTER — Ambulatory Visit (HOSPITAL_COMMUNITY)
Admission: RE | Admit: 2024-03-19 | Discharge: 2024-03-19 | Disposition: A | Discharge status: Home / Self Care | Source: Ambulatory Visit | Attending: Family Medicine | Admitting: Family Medicine

## 2024-03-19 VITALS — BP 153/92 | HR 77 | Temp 99.0°F | Ht 73.0 in | Wt 312.1 lb

## 2024-03-19 DIAGNOSIS — M47812 Spondylosis without myelopathy or radiculopathy, cervical region: Secondary | ICD-10-CM | POA: Diagnosis not present

## 2024-03-19 DIAGNOSIS — Z130 Encounter for screening for diseases of the blood and blood-forming organs and certain disorders involving the immune mechanism: Secondary | ICD-10-CM | POA: Diagnosis not present

## 2024-03-19 DIAGNOSIS — M25519 Pain in unspecified shoulder: Secondary | ICD-10-CM | POA: Diagnosis not present

## 2024-03-19 DIAGNOSIS — Z0000 Encounter for general adult medical examination without abnormal findings: Secondary | ICD-10-CM

## 2024-03-19 DIAGNOSIS — E119 Type 2 diabetes mellitus without complications: Secondary | ICD-10-CM | POA: Diagnosis not present

## 2024-03-19 DIAGNOSIS — M542 Cervicalgia: Secondary | ICD-10-CM | POA: Diagnosis present

## 2024-03-19 DIAGNOSIS — E782 Mixed hyperlipidemia: Secondary | ICD-10-CM

## 2024-03-19 DIAGNOSIS — Z0001 Encounter for general adult medical examination with abnormal findings: Secondary | ICD-10-CM | POA: Diagnosis not present

## 2024-03-19 NOTE — Assessment & Plan Note (Signed)
 Concern for cervical radiculopathy.  X-ray for further evaluation.

## 2024-03-19 NOTE — Progress Notes (Signed)
 Subjective:  Patient ID: Frank Ruiz, male    DOB: 27-May-1990  Age: 33 y.o. MRN: [id number]  CC:   Chief Complaint  Patient presents with   Annual Exam    Patient is here for a physical    HPI:  33 year old male presents for annual exam.  BP is elevated here.  Has been stable.  Needs weight loss.  Patient needs eye exam.  Needs labs including urine microalbumin.  His flu shot is up-to-date.  Patient reports that he continues to have posterior neck pain.  He is not having radicular symptoms as he is having numbness and tingling in his left hand.  He states that he has been seeing a chiropractor with improvement but no resolution.  Patient Active Problem List   Diagnosis Date Noted   Neck pain 03/19/2024   Annual physical exam 03/19/2023   Type 2 diabetes mellitus without complications (HCC) 05/24/2022   Mixed hyperlipidemia 02/15/2022   Class 3 severe obesity due to excess calories without serious comorbidity with body mass index (BMI) of 40.0 to 44.9 in adult [facility]) 03/14/2020   Essential hypertension 01/08/2018    Social Hx   Social History   Socioeconomic History   Marital status: Married    Spouse name: Not on file   Number of children: Not on file   Years of education: Not on file   Highest education level: Not on file  Occupational History   Not on file  Tobacco Use   Smoking status: Never   Smokeless tobacco: Never  Substance and Sexual Activity   Alcohol use: Not on file   Drug use: Not on file   Sexual activity: Not on file  Other Topics Concern   Not on file  Social History Narrative   Not on file   Social Drivers of Health   Tobacco Use: Low Risk (11/29/2023)   Patient History    Smoking Tobacco Use: Never    Smokeless Tobacco Use: Never    Passive Exposure: Not on file  Financial Resource Strain: Not on file  Food Insecurity: Not on file  Transportation Needs: Not on file  Physical Activity: Not on file  Stress: Not on file  Social  Connections: Not on file  Depression (PHQ2-9): Low Risk (03/19/2024)   Depression (PHQ2-9)    PHQ-2 Score: 0  Alcohol Screen: Not on file  Housing: Not on file  Utilities: Not on file  Health Literacy: Not on file    Review of Systems Per HPI  Objective:  BP (!) 153/92 (BP Location: Left Arm, Patient Position: Sitting)   Pulse 77   Temp 99 F (37.2 C)   Ht 6' 1 (1.854 m)   Wt (!) 312 lb 2 oz (141.6 kg)   SpO2 97%   BMI 41.18 kg/m      03/19/2024    8:14 AM 12/06/2023   11:25 AM 11/29/2023   10:51 AM  BP/Weight  Systolic BP 153 133 129  Diastolic BP 92 89 86  Wt. (Lbs) 312.13 214   BMI 41.18 kg/m2 28.23 kg/m2     Physical Exam Vitals and nursing note reviewed.  Constitutional:      Appearance: Normal appearance. He is obese.  HENT:     Head: Normocephalic and atraumatic.  Eyes:     General:        Right eye: No discharge.        Left eye: No discharge.     Conjunctiva/sclera: Conjunctivae normal.  Cardiovascular:  Rate and Rhythm: Normal rate and regular rhythm.  Pulmonary:     Effort: Pulmonary effort is normal.     Breath sounds: Normal breath sounds.  Neurological:     Mental Status: He is alert.  Psychiatric:        Mood and Affect: Mood normal.        Behavior: Behavior normal.     Lab Results  Component Value Date   WBC 6.1 04/06/2022   HGB 15.8 04/06/2022   HCT 47.4 04/06/2022   PLT 289 04/06/2022   GLUCOSE 131 (H) 03/19/2023   CHOL 175 03/19/2023   TRIG 76 03/19/2023   HDL 43 03/19/2023   LDLCALC 118 (H) 03/19/2023   ALT 33 03/19/2023   AST 21 03/19/2023   NA 141 03/19/2023   K 4.4 03/19/2023   CL 101 03/19/2023   CREATININE 1.15 03/19/2023   BUN 11 03/19/2023   CO2 22 03/19/2023   HGBA1C 6.3 (H) 03/19/2023     Assessment & Plan:  Annual physical exam Assessment & Plan: Flu vaccine up-to-date.  Labs today.   Type 2 diabetes mellitus without complication, without long-term current use of insulin (HCC) -      CMP14+EGFR -     Hemoglobin A1c -     Microalbumin / creatinine urine ratio  Mixed hyperlipidemia -     Lipid panel  Screening for deficiency anemia -     CBC  Neck pain Assessment & Plan: Concern for cervical radiculopathy.  X-ray for further evaluation.  Orders: -     DG Cervical Spine Complete    Follow-up: 3 to 6 months  Azarias Chiou Bluford DO [facility]

## 2024-03-19 NOTE — Patient Instructions (Signed)
 Xray at your convenience.  Labs as well.  We will call with results.  Follow up in 3-6 months.

## 2024-03-19 NOTE — Assessment & Plan Note (Signed)
 Flu vaccine up-to-date.  Labs today.

## 2024-03-24 DIAGNOSIS — M542 Cervicalgia: Secondary | ICD-10-CM | POA: Diagnosis not present

## 2024-04-22 ENCOUNTER — Ambulatory Visit: Admitting: Family Medicine

## 2024-09-17 ENCOUNTER — Ambulatory Visit: Admitting: Family Medicine
